# Patient Record
Sex: Female | Born: 1950 | Race: White | Hispanic: No | Marital: Married | Smoking: Never smoker
Health system: Southern US, Community
[De-identification: ages and names within clinical notes are randomized; demographics above are authoritative.]

## PROBLEM LIST (undated history)

## (undated) HISTORY — PX: CATARACT EXTRACTION: SUR2

## (undated) HISTORY — PX: COLONOSCOPY: SHX174

## (undated) HISTORY — PX: APPENDECTOMY: SHX54

## (undated) HISTORY — PX: POLYPECTOMY: SHX149

---

## 1999-09-10 ENCOUNTER — Encounter: Payer: Self-pay | Admitting: Obstetrics and Gynecology

## 1999-09-10 ENCOUNTER — Encounter: Admission: RE | Admit: 1999-09-10 | Discharge: 1999-09-10 | Payer: Self-pay | Admitting: Obstetrics and Gynecology

## 2001-10-07 ENCOUNTER — Encounter: Payer: Self-pay | Admitting: Obstetrics and Gynecology

## 2001-10-07 ENCOUNTER — Encounter: Admission: RE | Admit: 2001-10-07 | Discharge: 2001-10-07 | Payer: Self-pay | Admitting: Obstetrics and Gynecology

## 2003-10-25 ENCOUNTER — Encounter: Admission: RE | Admit: 2003-10-25 | Discharge: 2003-10-25 | Payer: Self-pay | Admitting: Obstetrics and Gynecology

## 2004-08-16 ENCOUNTER — Emergency Department (HOSPITAL_COMMUNITY): Admission: EM | Admit: 2004-08-16 | Discharge: 2004-08-17 | Payer: Self-pay | Admitting: Emergency Medicine

## 2004-08-28 ENCOUNTER — Ambulatory Visit: Payer: Self-pay | Admitting: Family Medicine

## 2004-10-02 ENCOUNTER — Ambulatory Visit: Payer: Self-pay | Admitting: Family Medicine

## 2004-10-26 ENCOUNTER — Ambulatory Visit (HOSPITAL_COMMUNITY): Admission: RE | Admit: 2004-10-26 | Discharge: 2004-10-26 | Payer: Self-pay | Admitting: Obstetrics and Gynecology

## 2005-05-11 ENCOUNTER — Encounter: Admission: RE | Admit: 2005-05-11 | Discharge: 2005-05-11 | Payer: Self-pay | Admitting: Orthopedic Surgery

## 2006-06-11 ENCOUNTER — Ambulatory Visit: Payer: Self-pay | Admitting: Family Medicine

## 2006-11-07 ENCOUNTER — Ambulatory Visit: Payer: Self-pay | Admitting: Internal Medicine

## 2006-11-21 ENCOUNTER — Encounter (INDEPENDENT_AMBULATORY_CARE_PROVIDER_SITE_OTHER): Payer: Self-pay | Admitting: Internal Medicine

## 2006-11-21 ENCOUNTER — Encounter: Payer: Self-pay | Admitting: Internal Medicine

## 2006-11-21 ENCOUNTER — Ambulatory Visit: Payer: Self-pay | Admitting: Internal Medicine

## 2007-11-16 ENCOUNTER — Telehealth (INDEPENDENT_AMBULATORY_CARE_PROVIDER_SITE_OTHER): Payer: Self-pay | Admitting: Internal Medicine

## 2007-11-16 ENCOUNTER — Ambulatory Visit: Payer: Self-pay | Admitting: Family Medicine

## 2007-11-16 DIAGNOSIS — Z872 Personal history of diseases of the skin and subcutaneous tissue: Secondary | ICD-10-CM | POA: Insufficient documentation

## 2007-11-18 ENCOUNTER — Emergency Department (HOSPITAL_COMMUNITY): Admission: EM | Admit: 2007-11-18 | Discharge: 2007-11-18 | Payer: Self-pay | Admitting: Family Medicine

## 2007-11-20 ENCOUNTER — Inpatient Hospital Stay (HOSPITAL_COMMUNITY): Admission: AD | Admit: 2007-11-20 | Discharge: 2007-11-26 | Payer: Self-pay | Admitting: Obstetrics and Gynecology

## 2007-11-23 ENCOUNTER — Telehealth (INDEPENDENT_AMBULATORY_CARE_PROVIDER_SITE_OTHER): Payer: Self-pay | Admitting: Internal Medicine

## 2007-11-23 ENCOUNTER — Ambulatory Visit: Payer: Self-pay | Admitting: Infectious Diseases

## 2008-04-26 ENCOUNTER — Ambulatory Visit: Payer: Self-pay | Admitting: Family Medicine

## 2008-04-26 DIAGNOSIS — IMO0002 Reserved for concepts with insufficient information to code with codable children: Secondary | ICD-10-CM | POA: Insufficient documentation

## 2008-06-19 ENCOUNTER — Emergency Department (HOSPITAL_COMMUNITY): Admission: EM | Admit: 2008-06-19 | Discharge: 2008-06-19 | Payer: Self-pay | Admitting: Emergency Medicine

## 2008-10-04 ENCOUNTER — Other Ambulatory Visit: Admission: RE | Admit: 2008-10-04 | Discharge: 2008-10-04 | Payer: Self-pay | Admitting: Obstetrics and Gynecology

## 2008-10-21 ENCOUNTER — Encounter: Admission: RE | Admit: 2008-10-21 | Discharge: 2008-10-21 | Payer: Self-pay | Admitting: Obstetrics and Gynecology

## 2008-11-04 ENCOUNTER — Ambulatory Visit: Payer: Self-pay | Admitting: Family Medicine

## 2008-11-04 DIAGNOSIS — R030 Elevated blood-pressure reading, without diagnosis of hypertension: Secondary | ICD-10-CM

## 2008-11-04 DIAGNOSIS — J0100 Acute maxillary sinusitis, unspecified: Secondary | ICD-10-CM

## 2008-11-24 ENCOUNTER — Encounter (INDEPENDENT_AMBULATORY_CARE_PROVIDER_SITE_OTHER): Payer: Self-pay | Admitting: Internal Medicine

## 2008-11-30 ENCOUNTER — Encounter (INDEPENDENT_AMBULATORY_CARE_PROVIDER_SITE_OTHER): Payer: Self-pay | Admitting: Internal Medicine

## 2008-12-28 ENCOUNTER — Encounter (INDEPENDENT_AMBULATORY_CARE_PROVIDER_SITE_OTHER): Payer: Self-pay | Admitting: Internal Medicine

## 2009-09-04 ENCOUNTER — Telehealth: Payer: Self-pay | Admitting: Family Medicine

## 2010-01-03 IMAGING — MG MM SCREEN MAMMOGRAM BILATERAL
4 series · 4 of 4 positions shown · non-contrast
Comparison: none

DG SCREEN MAMMOGRAM BILATERAL
Bilateral CC and MLO view(s) were taken.
Technologist: Mahop Biko

DIGITAL SCREENING MAMMOGRAM WITH CAD:
The breast tissue is heterogeneously dense.  No masses or malignant type calcifications are 
identified.  Compared with prior studies.
Images were processed with CAD.

[R CC]
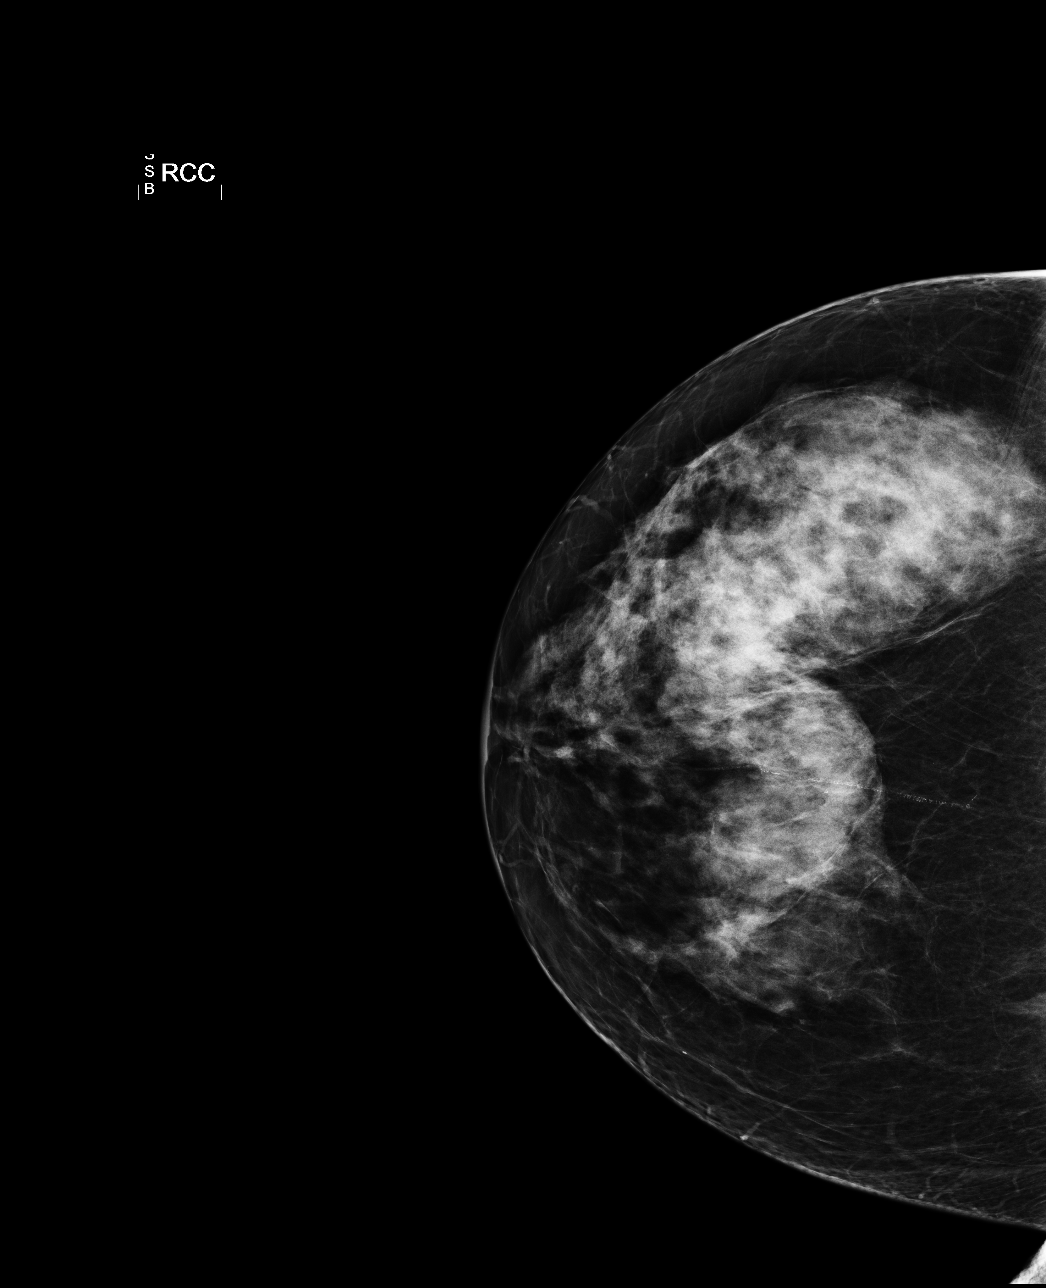

[L CC]
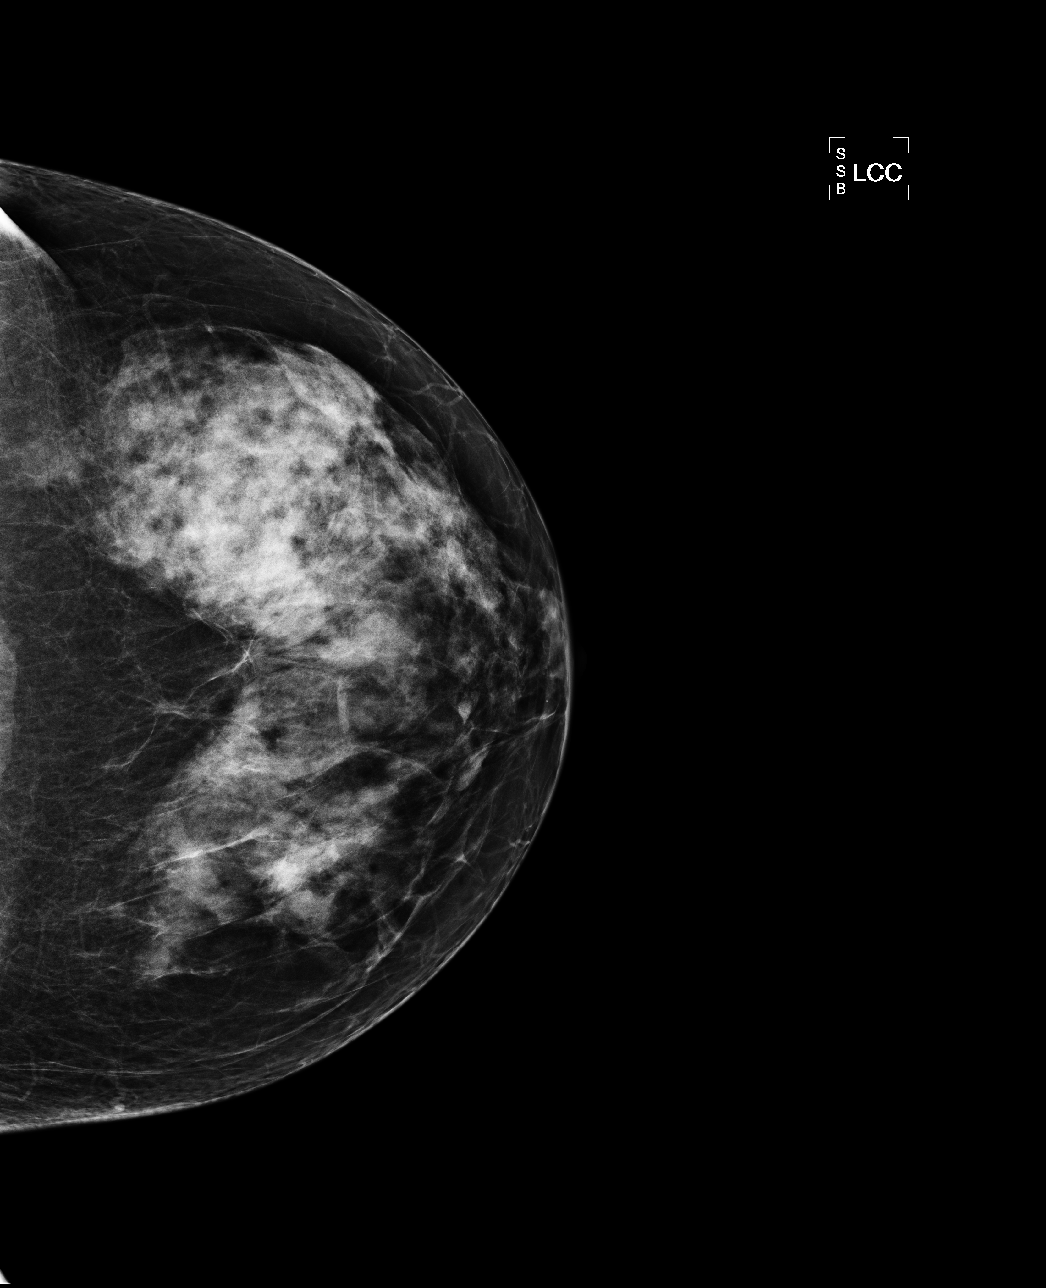

[L MLO]
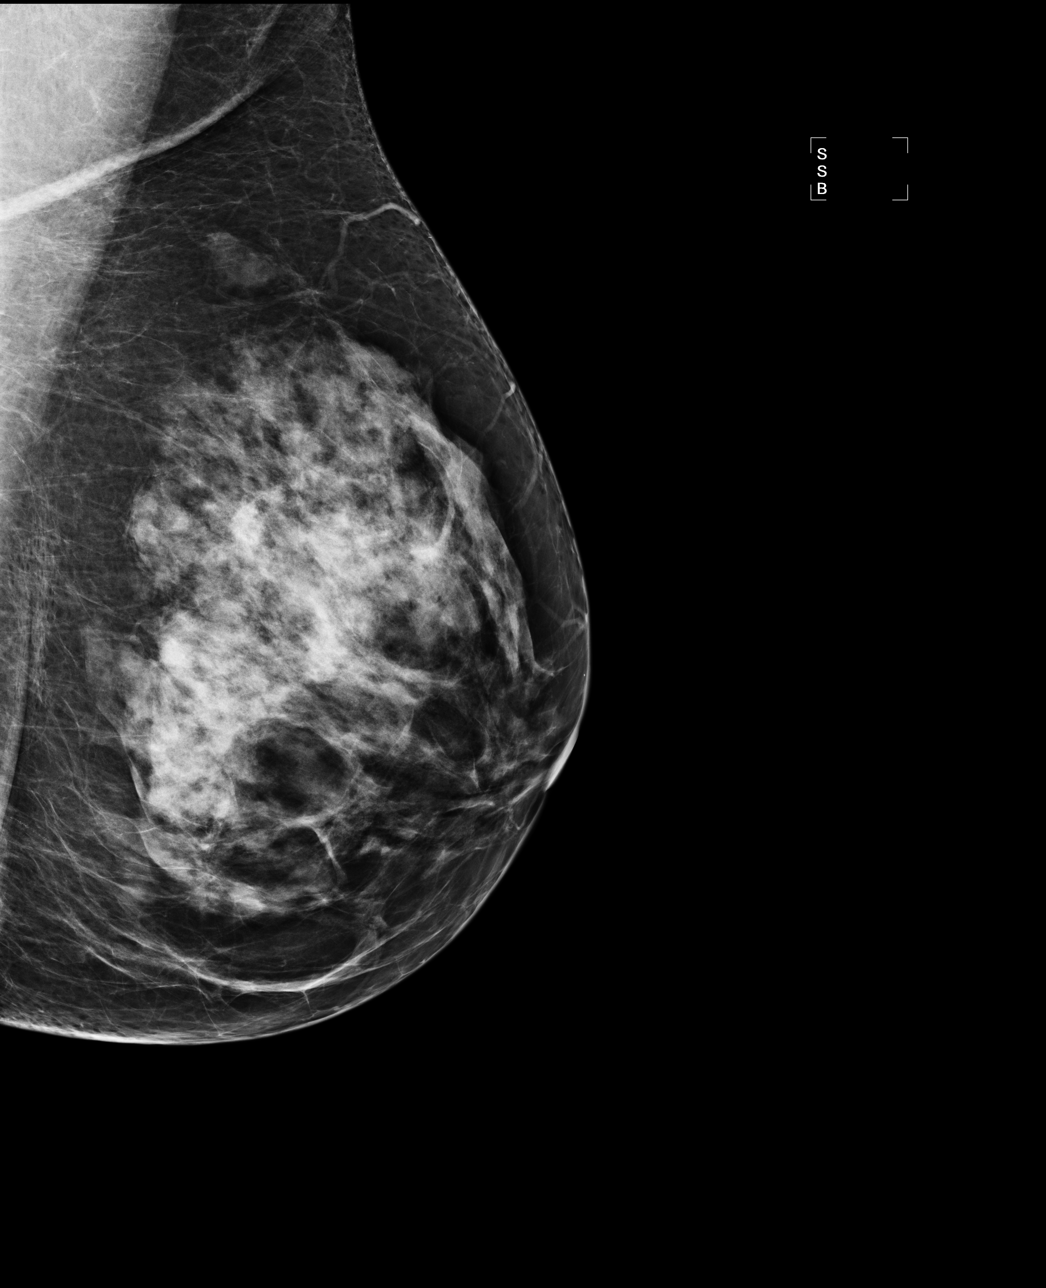

[R MLO]
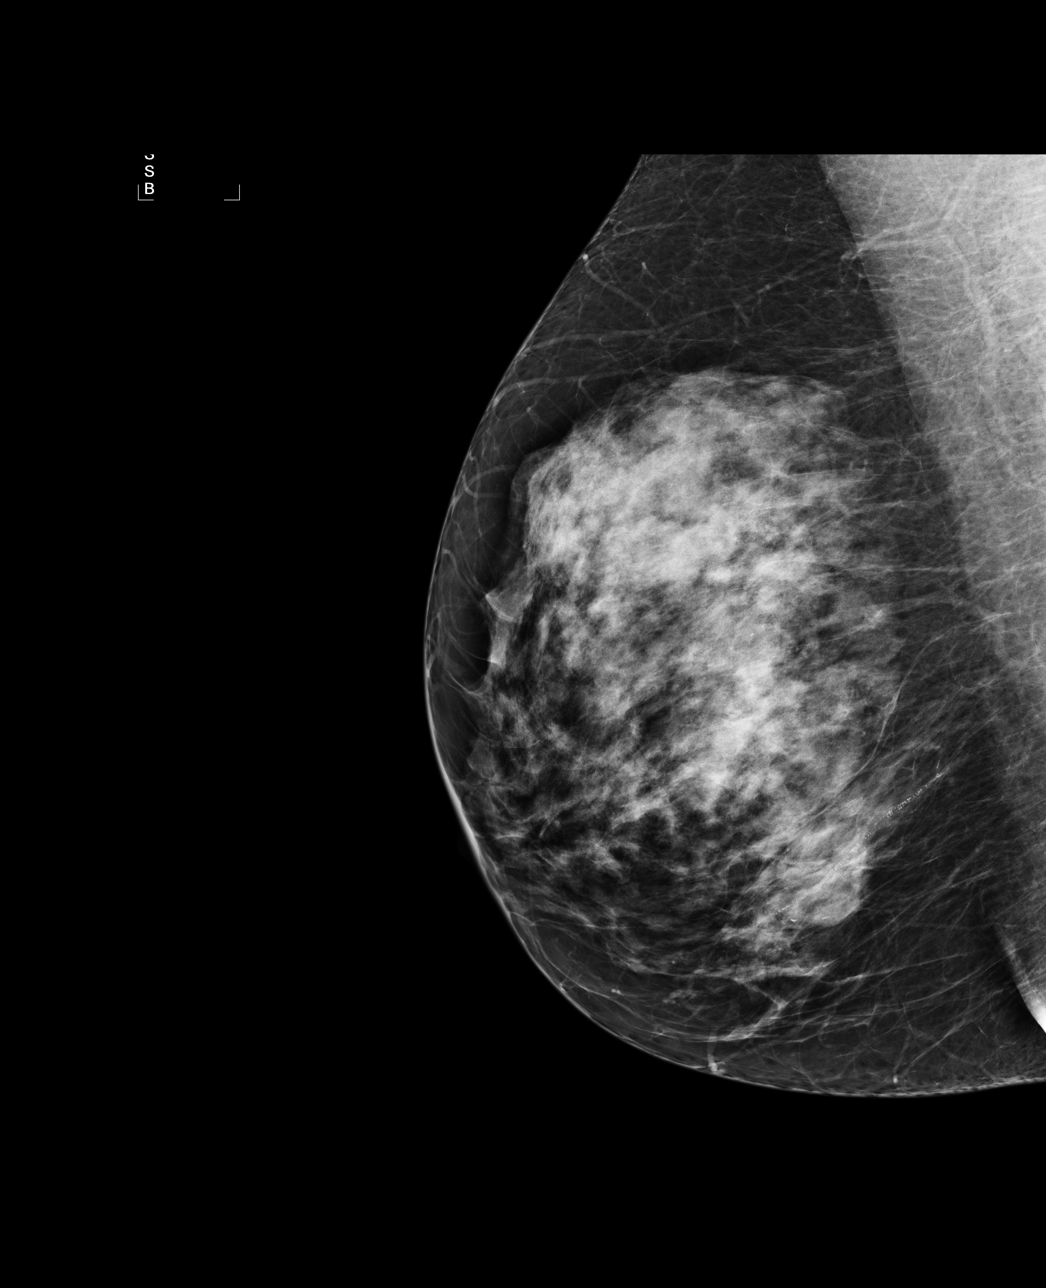

[4 of 4 positions shown; findings below may reference images not displayed]

IMPRESSION: No specific mammographic evidence of malignancy.  Next screening mammogram is recommended in one 
year.

A result letter of this screening mammogram will be mailed directly to the patient.

ASSESSMENT: Negative - BI-RADS 1

Screening mammogram in 1 year.
ANALYZED BY COMPUTER AIDED DETECTION. , THIS PROCEDURE WAS A DIGITAL MAMMOGRAM.

## 2010-05-29 NOTE — Progress Notes (Signed)
Summary: pt requests something for anxiety  Phone Note Call from Patient Call back at Work Phone [phone]   Caller: Patient Summary of Call: Pt states her mother is dying and she is asking if she can have something like paxil or xanax. She knows she will have to get established with another provider.    [facility]. Initial call taken by: Lowella Petties CMA,  Sep 04, 2009 8:59 AM  Follow-up for Phone Call        Medication phoned to pharmacy. Patient Advised.  Follow-up by: Delilah Shan CMA Duncan Dull),  Sep 04, 2009 9:52 AM    New/Updated Medications: ALPRAZOLAM 0.25 MG TABS (ALPRAZOLAM) 1 tab by mouth three times a day as needed anxiety Prescriptions: ALPRAZOLAM 0.25 MG TABS (ALPRAZOLAM) 1 tab by mouth three times a day as needed anxiety  #20 x 0   Entered and Authorized by:   Ruthe Mannan MD   Signed by:   Ruthe Mannan MD on 09/04/2009   Method used:   Telephoned to ...       [facility]* (retail)       [address]       [place], [place]  [address]       Ph: [phone]       Fax: [phone]   RxID:   [id number]

## 2010-05-30 ENCOUNTER — Other Ambulatory Visit (HOSPITAL_COMMUNITY)
Admission: RE | Admit: 2010-05-30 | Discharge: 2010-05-30 | Disposition: A | Discharge status: Home / Self Care | Payer: BC Managed Care – PPO | Source: Ambulatory Visit | Attending: Obstetrics and Gynecology | Admitting: Obstetrics and Gynecology

## 2010-05-30 ENCOUNTER — Other Ambulatory Visit: Payer: Self-pay | Admitting: Obstetrics and Gynecology

## 2010-05-30 DIAGNOSIS — Z01419 Encounter for gynecological examination (general) (routine) without abnormal findings: Secondary | ICD-10-CM | POA: Insufficient documentation

## 2010-08-14 LAB — CBC
HCT: 40.7 % (ref 36.0–46.0)
MCHC: 33.1 g/dL (ref 30.0–36.0)
MCV: 88.5 fL (ref 78.0–100.0)
RBC: 4.6 MIL/uL (ref 3.87–5.11)
WBC: 11.7 10*3/uL — ABNORMAL HIGH (ref 4.0–10.5)

## 2010-08-14 LAB — CK TOTAL AND CKMB (NOT AT ARMC)
CK, MB: 1.5 ng/mL (ref 0.3–4.0)
Relative Index: INVALID (ref 0.0–2.5)

## 2010-08-14 LAB — BASIC METABOLIC PANEL
BUN: 14 mg/dL (ref 6–23)
CO2: 28 mEq/L (ref 19–32)
GFR calc Af Amer: >60 mL/min (ref >60)
GFR calc non Af Amer: >60 mL/min (ref >60)
Glucose, Bld: 120 mg/dL — ABNORMAL HIGH (ref 70–99)
Potassium: 3.8 mEq/L (ref 3.5–5.1)

## 2010-08-14 LAB — DIFFERENTIAL
Eosinophils Absolute: 0.3 10*3/uL (ref 0.0–0.7)
Eosinophils Relative: 2 % (ref 0–5)
Lymphs Abs: 0.4 10*3/uL — ABNORMAL LOW (ref 0.7–4.0)
Monocytes Absolute: 0.6 10*3/uL (ref 0.1–1.0)
Monocytes Relative: 5 % (ref 3–12)
Neutro Abs: 10.5 10*3/uL — ABNORMAL HIGH (ref 1.7–7.7)

## 2010-09-11 NOTE — Op Note (Signed)
NAMECRYSTEN, KAMAN              ACCOUNT NO.:  [id number]   MEDICAL RECORD NO.:  [id number]          PATIENT TYPE:  INP   LOCATION:  9319                          FACILITY:  [facility]   PHYSICIAN:  Charles A. Delcambre, MDDATE OF BIRTH:  Feb 16, 1951   DATE OF PROCEDURE:  11/20/2007  DATE OF DISCHARGE:                               OPERATIVE REPORT   PREOPERATIVE DIAGNOSES:  1. Vulvar abscess.  2. Vulvar cellulitis.   POSTOPERATIVE DIAGNOSES:  1. Vulvar abscess.  2. Vulvar cellulitis.   PROCEDURES:  1. Exploration of vulvar abscess and cellulitis under anesthesia.  2. Incision and drainage of abscessed area.  3. Packing of abscessed area.  4. Cultures of abscessed area.   SURGEON:  Charles A. Delcambre, MD   ASSISTANT:  None.   COMPLICATIONS:  None.   ESTIMATED BLOOD LOSS:  Less than 10 mL.   ANESTHESIA:  Monitored anesthesia care.   OPERATIVE FINDINGS:  Increased area of induration and erythema  approximately 1 cm beyond the marks I made in the office this afternoon.  This would have been about 6 hours ago.  There was no evidence of  necrosis, dusky, or dark tissue or crepitants anywhere about the lesion.  Cultures were done and sent to micro.   INSTRUMENT, SPONGE, AND NEEDLE COUNT:  Correct x2.   DESCRIPTION OF PROCEDURE:  The patient was taken to the operating,  placed in supine position, and sedated.  With adequate anesthesia, she  was placed in dorsal lithotomy position in Universal stirrups.  Sterile  prep and drape was undertaken.  At the area of the spontaneous opening  of the abscess on the inferior aspect of the left labia majora, an 11  blade knife was used to create a slightly larger opening.  I got no  significant effluent other than some exudate.  Culture was taken from  inside the incision and the crevices of the former abscess cavity.  Hemostat was used to break up loculations, but minimal effluent was  noted.  The area was then packed with 0.25-inch  Iodoform up into the  apices of the abscess cavity.  Surrounding erythema was marked in  coordination with erythema and induration.  The mons area was aspirated  with 20 gauge but could get no material out of that area.  Marcaine  0.25% was injected 7 mL for the incision site before the packing.  The  patient tolerated the procedure well, was awakened, and taken to  recovery.  There was no evidence spread beyond the labia intravaginally,  but there was spread superiorly along fascial planes apparently to the  mons area.  This will be watched very closely, and the patient will  remain on Invanz.  Reassess in the morning or otherwise as indicated.       Charles A. Sydnee Cabal, MD  Electronically Signed     CAD/MEDQ  D:  11/20/2007  T:  11/21/2007  Job:  [id number]

## 2010-09-14 NOTE — Discharge Summary (Signed)
Misty Vega, Misty Vega              ACCOUNT NO.:  [id number]   MEDICAL RECORD NO.:  [id number]          PATIENT TYPE:  INP   LOCATION:  9319                          FACILITY:  [facility]   PHYSICIAN:  Charles A. Delcambre, MDDATE OF BIRTH:  03/11/1951   DATE OF ADMISSION:  11/20/2007  DATE OF DISCHARGE:  11/26/2007                               DISCHARGE SUMMARY   DISCHARGE DIAGNOSIS:  Vulvar abscess cellulitis with methicillin-  resistant Staphylococcus aureus.   PROCEDURE:  Exploration of vulvar abscess cellulitis under anesthesia,  incision and drainage with packing.   CULTURES DONE:  Culture did return methicillin-resistant Staphylococcus  aureus.   Final hematocrit 32.2, hemoglobin 10.9, and white blood cell count 6.4.  Of note, which will need to be followed with general medical physician.  AST 56, ALT 56, otherwise, Chem-20 within normal limits.   HISTORY AND PHYSICAL:  As dictated on the chart.   HOSPITAL COURSE:  The patient was admitted and underwent IV antibiotics.  After surgery, she was initially felt to be stable and without culture  knowledge, was started on Invanz.  She continued on her antibiotics.  On  hospital day #2, the decision was made to add doxycycline and with MRSA  returning she was started on vancomycin by infectious disease doctor.  After, I did begin to follow and recommended, we continue vancomycin  this was continued for 2 days and she continued to improve on day three  vancomycin.  She went on to day 4 and at that point, she was discharged  home that afternoon on Bactrim DS 1 p.o. b.i.d.  This was discussed with  Dr. Ninetta Lights who concurred.  She was given precautions for recurrent  lesions or fever over 100 degrees.      Charles A. Sydnee Cabal, MD  Electronically Signed     CAD/MEDQ  D:  01/13/2008  T:  01/14/2008  Job:  [id number]

## 2011-01-25 LAB — DIFFERENTIAL
Basophils Absolute: 0
Eosinophils Absolute: 0.2
Eosinophils Relative: 3
Lymphs Abs: 1.5
Monocytes Relative: 10
Neutrophils Relative %: 54

## 2011-01-25 LAB — COMPREHENSIVE METABOLIC PANEL
ALT: 63 — ABNORMAL HIGH
AST: 56 — ABNORMAL HIGH
Albumin: 2.9 — ABNORMAL LOW
Alkaline Phosphatase: 67
BUN: 7
BUN: 7
CO2: 26
CO2: 31
Calcium: 8.7
Chloride: 104
Chloride: 105
Creatinine, Ser: 0.69
Creatinine, Ser: 0.82
GFR calc Af Amer: >60
GFR calc non Af Amer: >60
Sodium: 139
Total Bilirubin: 0.4
Total Protein: 6.5

## 2011-01-25 LAB — ANAEROBIC CULTURE

## 2011-01-25 LAB — CBC
HCT: 32.2 — ABNORMAL LOW
Hemoglobin: 10.9 — ABNORMAL LOW
Hemoglobin: 9.8 — ABNORMAL LOW
MCV: 86.9
MCV: 86.9
Platelets: 291
RDW: 13.3
WBC: 5
WBC: 6.4

## 2011-01-25 LAB — CULTURE, ROUTINE-ABSCESS: Gram Stain: NONE SEEN

## 2011-12-10 ENCOUNTER — Encounter: Payer: Self-pay | Admitting: Internal Medicine

## 2012-01-01 ENCOUNTER — Other Ambulatory Visit: Payer: Self-pay | Admitting: Obstetrics and Gynecology

## 2012-01-01 DIAGNOSIS — Z1231 Encounter for screening mammogram for malignant neoplasm of breast: Secondary | ICD-10-CM

## 2012-02-11 ENCOUNTER — Other Ambulatory Visit: Payer: Self-pay | Admitting: Obstetrics and Gynecology

## 2012-02-11 ENCOUNTER — Other Ambulatory Visit (HOSPITAL_COMMUNITY)
Admission: RE | Admit: 2012-02-11 | Discharge: 2012-02-11 | Disposition: A | Discharge status: Home / Self Care | Payer: BC Managed Care – PPO | Source: Ambulatory Visit | Attending: Obstetrics and Gynecology | Admitting: Obstetrics and Gynecology

## 2012-02-11 ENCOUNTER — Ambulatory Visit
Admission: RE | Admit: 2012-02-11 | Discharge: 2012-02-11 | Disposition: A | Discharge status: Home / Self Care | Payer: BC Managed Care – PPO | Source: Ambulatory Visit | Attending: Obstetrics and Gynecology | Admitting: Obstetrics and Gynecology

## 2012-02-11 DIAGNOSIS — Z01419 Encounter for gynecological examination (general) (routine) without abnormal findings: Secondary | ICD-10-CM | POA: Insufficient documentation

## 2012-02-11 DIAGNOSIS — Z1231 Encounter for screening mammogram for malignant neoplasm of breast: Secondary | ICD-10-CM

## 2012-02-17 ENCOUNTER — Other Ambulatory Visit: Payer: Self-pay | Admitting: Obstetrics and Gynecology

## 2012-02-17 DIAGNOSIS — R928 Other abnormal and inconclusive findings on diagnostic imaging of breast: Secondary | ICD-10-CM

## 2012-02-20 ENCOUNTER — Ambulatory Visit
Admission: RE | Admit: 2012-02-20 | Discharge: 2012-02-20 | Disposition: A | Discharge status: Home / Self Care | Payer: BC Managed Care – PPO | Source: Ambulatory Visit | Attending: Obstetrics and Gynecology | Admitting: Obstetrics and Gynecology

## 2012-02-20 DIAGNOSIS — R928 Other abnormal and inconclusive findings on diagnostic imaging of breast: Secondary | ICD-10-CM

## 2012-08-06 ENCOUNTER — Encounter: Payer: Self-pay | Admitting: Internal Medicine

## 2012-09-09 ENCOUNTER — Other Ambulatory Visit: Payer: Self-pay | Admitting: Obstetrics and Gynecology

## 2012-09-09 DIAGNOSIS — D242 Benign neoplasm of left breast: Secondary | ICD-10-CM

## 2012-09-15 ENCOUNTER — Ambulatory Visit
Admission: RE | Admit: 2012-09-15 | Discharge: 2012-09-15 | Disposition: A | Discharge status: Home / Self Care | Payer: BC Managed Care – PPO | Source: Ambulatory Visit | Attending: Obstetrics and Gynecology | Admitting: Obstetrics and Gynecology

## 2012-09-15 DIAGNOSIS — D242 Benign neoplasm of left breast: Secondary | ICD-10-CM

## 2013-02-03 ENCOUNTER — Other Ambulatory Visit: Payer: Self-pay

## 2013-02-03 DIAGNOSIS — Z1231 Encounter for screening mammogram for malignant neoplasm of breast: Secondary | ICD-10-CM

## 2013-02-23 ENCOUNTER — Ambulatory Visit
Admission: RE | Admit: 2013-02-23 | Discharge: 2013-02-23 | Disposition: A | Discharge status: Home / Self Care | Payer: BC Managed Care – PPO | Source: Ambulatory Visit

## 2013-02-23 DIAGNOSIS — Z1231 Encounter for screening mammogram for malignant neoplasm of breast: Secondary | ICD-10-CM

## 2013-03-02 ENCOUNTER — Ambulatory Visit: Payer: BC Managed Care – PPO

## 2013-07-09 ENCOUNTER — Emergency Department (HOSPITAL_COMMUNITY)
Admission: EM | Admit: 2013-07-09 | Discharge: 2013-07-09 | Disposition: A | Discharge status: Home / Self Care | Payer: BC Managed Care – PPO | Source: Home / Self Care | Attending: Family Medicine | Admitting: Family Medicine

## 2013-07-09 ENCOUNTER — Encounter (HOSPITAL_COMMUNITY): Payer: Self-pay | Admitting: Emergency Medicine

## 2013-07-09 DIAGNOSIS — K529 Noninfective gastroenteritis and colitis, unspecified: Secondary | ICD-10-CM

## 2013-07-09 DIAGNOSIS — K5289 Other specified noninfective gastroenteritis and colitis: Secondary | ICD-10-CM

## 2013-07-09 MED ORDER — ONDANSETRON 4 MG PO TBDP
ORAL_TABLET | ORAL | Status: AC
  Filled 2013-07-09: qty 2

## 2013-07-09 MED ORDER — ONDANSETRON HCL 4 MG PO TABS: 4.0000 mg | ORAL_TABLET | Freq: Four times a day (QID) | ORAL | Status: DC

## 2013-07-09 MED ORDER — ONDANSETRON 4 MG PO TBDP
8.0000 mg | ORAL_TABLET | Freq: Once | ORAL | Status: AC
  Administered 2013-07-09: 8 mg via ORAL

## 2013-07-09 NOTE — ED Notes (Signed)
Pt  Has  Symptoms  Of  Nausea   Vomiting /  Diarrhea   X         1  Week  On and  Off

## 2013-07-09 NOTE — ED Provider Notes (Signed)
CSN: [id number]     Arrival date & time 07/09/13  1016 History   First MD Initiated Contact with Patient 07/09/13 1121     No chief complaint on file.  (Consider location/radiation/quality/duration/timing/severity/associated sxs/prior Treatment) Patient is a 63 y.o. female presenting with vomiting. The history is provided by the patient.  Emesis Severity:  Moderate Duration:  3 days Quality:  Stomach contents Progression:  Improving Worsened by:  Nothing tried Ineffective treatments:  None tried Associated symptoms: diarrhea   Associated symptoms: no abdominal pain and no fever   Risk factors: no sick contacts and no suspect food intake     No past medical history on file. No past surgical history on file. No family history on file. History  Substance Use Topics  . Smoking status: Not on file  . Smokeless tobacco: Not on file  . Alcohol Use: Not on file   OB History   No data available     Review of Systems  Constitutional: Negative.   Cardiovascular: Negative.   Gastrointestinal: Positive for nausea, vomiting and diarrhea. Negative for abdominal pain and constipation.  Skin: Negative.     Allergies  Review of patient's allergies indicates no known allergies.  Home Medications   Current Outpatient Rx  Name  Route  Sig  Dispense  Refill  . ondansetron (ZOFRAN) 4 MG tablet   Oral   Take 1 tablet (4 mg total) by mouth every 6 (six) hours.   8 tablet   0    BP 145/87  Pulse 77  Temp(Src) 98.9 F (37.2 C) (Oral)  Resp 16  SpO2 98% Physical Exam  Nursing note and vitals reviewed. Constitutional: She is oriented to person, place, and time. She appears well-developed and well-nourished. No distress.  HENT:  Head: Normocephalic.  Mouth/Throat: Oropharynx is clear and moist.  Eyes: Conjunctivae are normal. Pupils are equal, round, and reactive to light.  Neck: Normal range of motion. Neck supple.  Cardiovascular: Regular rhythm and normal heart sounds.    Pulmonary/Chest: Breath sounds normal.  Abdominal: Soft. Bowel sounds are normal. She exhibits no distension and no mass. There is no tenderness. There is no rebound and no guarding.  Lymphadenopathy:    She has no cervical adenopathy.  Neurological: She is alert and oriented to person, place, and time.  Skin: Skin is warm and dry.    ED Course  Procedures (including critical care time) Labs Review Labs Reviewed - No data to display Imaging Review No results found.   MDM   1. Gastroenteritis, acute        Billy Fischer, MD 07/09/13 1144

## 2013-07-09 NOTE — Discharge Instructions (Signed)
Clear liquid , bland diet tonight as tolerated, advance on sat as improved, use medicine as needed, return or see your doctor if any problems. °

## 2013-07-19 ENCOUNTER — Encounter: Payer: Self-pay | Admitting: Internal Medicine

## 2013-07-19 ENCOUNTER — Ambulatory Visit (INDEPENDENT_AMBULATORY_CARE_PROVIDER_SITE_OTHER): Payer: BC Managed Care – PPO | Admitting: Internal Medicine

## 2013-07-19 VITALS — BP 140/80 | HR 68 | Temp 98.0°F | Ht 66.5 in | Wt 171.5 lb

## 2013-07-19 DIAGNOSIS — Z0000 Encounter for general adult medical examination without abnormal findings: Secondary | ICD-10-CM

## 2013-07-19 LAB — COMPREHENSIVE METABOLIC PANEL
ALBUMIN: 4.1 g/dL (ref 3.5–5.2)
ALK PHOS: 66 U/L (ref 39–117)
ALT: 16 U/L (ref 0–35)
AST: 20 U/L (ref 0–37)
BUN: 13 mg/dL (ref 6–23)
CALCIUM: 9.3 mg/dL (ref 8.4–10.5)
CHLORIDE: 104 meq/L (ref 96–112)
CO2: 28 mEq/L (ref 19–32)
Creatinine, Ser: 0.9 mg/dL (ref 0.4–1.2)
GFR: 65.73 mL/min (ref >60.00)
GLUCOSE: 97 mg/dL (ref 70–99)
POTASSIUM: 4.1 meq/L (ref 3.5–5.1)
SODIUM: 139 meq/L (ref 135–145)
TOTAL PROTEIN: 7 g/dL (ref 6.0–8.3)
Total Bilirubin: 0.5 mg/dL (ref 0.3–1.2)

## 2013-07-19 LAB — CBC
HCT: 37.1 % (ref 36.0–46.0)
Hemoglobin: 12.4 g/dL (ref 12.0–15.0)
MCHC: 33.5 g/dL (ref 30.0–36.0)
MCV: 84.2 fl (ref 78.0–100.0)
Platelets: 300 10*3/uL (ref 150.0–400.0)
RBC: 4.4 Mil/uL (ref 3.87–5.11)
RDW: 13.6 % (ref 11.5–14.6)
WBC: 4.5 10*3/uL (ref 4.5–10.5)

## 2013-07-19 LAB — LIPID PANEL
CHOL/HDL RATIO: 3
Cholesterol: 155 mg/dL (ref 0–200)
HDL: 55.5 mg/dL (ref >39.00)
LDL CALC: 84 mg/dL (ref 0–99)
Triglycerides: 78 mg/dL (ref 0.0–149.0)
VLDL: 15.6 mg/dL (ref 0.0–40.0)

## 2013-07-19 NOTE — Progress Notes (Signed)
HPI Pt presents to the clinic today to establish care. She has not had a PCP in the last 4 years. She has no concerns today.  Flu: never Tetanus: 2006 Pap Smear: 2014 Mammogram: 2014 Colon Screening: 2008, 10 year followup Eye Doctor: yearly Dentist: biannually  History reviewed. No pertinent past medical history.  Current Outpatient Prescriptions  Medication Sig Dispense Refill  . Calcium-Vitamin D-Vitamin K (VIACTIV PO) Take by mouth daily.      . ondansetron (ZOFRAN) 4 MG tablet Take 1 tablet (4 mg total) by mouth every 6 (six) hours.  8 tablet  0   No current facility-administered medications for this visit.    No Known Allergies  Family History  Problem Relation Age of Onset  . Cancer Mother     liver  . Hypertension Father   . Diabetes Neg Hx   . Heart disease Neg Hx   . Stroke Neg Hx     History   Social History  . Marital Status: Married    Spouse Name: N/A    Number of Children: N/A  . Years of Education: N/A   Occupational History  . Not on file.   Social History Main Topics  . Smoking status: Never Smoker   . Smokeless tobacco: Never Used  . Alcohol Use: No  . Drug Use: No  . Sexual Activity: Not on file   Other Topics Concern  . Not on file   Social History Narrative  . No narrative on file    ROS:  Constitutional: Denies fever, malaise, fatigue, headache or abrupt weight changes.  HEENT: Denies eye pain, eye redness, ear pain, ringing in the ears, wax buildup, runny nose, nasal congestion, bloody nose, or sore throat. Respiratory: Denies difficulty breathing, shortness of breath, cough or sputum production.   Cardiovascular: Denies chest pain, chest tightness, palpitations or swelling in the hands or feet.  Gastrointestinal: Denies abdominal pain, bloating, constipation, diarrhea or blood in the stool.  GU: Denies frequency, urgency, pain with urination, blood in urine, odor or discharge. Musculoskeletal: Denies decrease in range of  motion, difficulty with gait, muscle pain or joint pain and swelling.  Skin: Denies redness, rashes, lesions or ulcercations.  Neurological: Denies dizziness, difficulty with memory, difficulty with speech or problems with balance and coordination.   No other specific complaints in a complete review of systems (except as listed in HPI above).  PE:  BP 140/80  Pulse 68  Temp(Src) 98 F (36.7 C) (Oral)  Ht 5' 6.5" (1.689 m)  Wt 171 lb 8 oz (77.792 kg)  BMI 27.27 kg/m2 Wt Readings from Last 3 Encounters:  07/19/13 171 lb 8 oz (77.792 kg)  11/04/08 160 lb 8 oz (72.802 kg)  04/26/08 163 lb (73.936 kg)    General: Appears her stated age, well developed, well nourished in NAD. HEENT: Head: normal shape and size; Eyes: sclera white, no icterus, conjunctiva pink, PERRLA and EOMs intact; Ears: Tm's gray and intact, normal light reflex; Nose: mucosa pink and moist, septum midline; Throat/Mouth: Teeth present, mucosa pink and moist, no lesions or ulcerations noted.  Neck: Normal range of motion. Neck supple, trachea midline. No massses, lumps or thyromegaly present.  Cardiovascular: Normal rate and rhythm. S1,S2 noted.  No murmur, rubs or gallops noted. No JVD or BLE edema. No carotid bruits noted. Pulmonary/Chest: Normal effort and positive vesicular breath sounds. No respiratory distress. No wheezes, rales or ronchi noted.  Abdomen: Soft and nontender. Normal bowel sounds, no bruits noted. No distention  or masses noted. Liver, spleen and kidneys non palpable. Musculoskeletal: Normal range of motion. No signs of joint swelling. No difficulty with gait.  Neurological: Alert and oriented. Cranial nerves II-XII intact. Coordination normal. +DTRs bilaterally. Psychiatric: Mood and affect normal. Behavior is normal. Judgment and thought content normal.   EKG:  BMET    Component Value Date/Time   NA 138 06/19/2008 1224   K 3.8 06/19/2008 1224   CL 103 06/19/2008 1224   CO2 28 06/19/2008 1224    GLUCOSE 120* 06/19/2008 1224   BUN 14 06/19/2008 1224   CREATININE 0.85 06/19/2008 1224   CALCIUM 9.2 06/19/2008 1224   GFRNONAA >60 06/19/2008 1224   GFRAA  Value: >60        The eGFR has been calculated using the MDRD equation. This calculation has not been validated in all clinical situations. eGFR's persistently <60 mL/min signify possible Chronic Kidney Disease. 06/19/2008 1224    Lipid Panel  No results found for this basename: chol, trig, hdl, cholhdl, vldl, ldlcalc    CBC    Component Value Date/Time   WBC 11.7* 06/19/2008 1224   RBC 4.60 06/19/2008 1224   HGB 13.5 06/19/2008 1224   HCT 40.7 06/19/2008 1224   PLT 246 06/19/2008 1224   MCV 88.5 06/19/2008 1224   MCHC 33.1 06/19/2008 1224   RDW 13.3 06/19/2008 1224   LYMPHSABS 0.4* 06/19/2008 1224   MONOABS 0.6 06/19/2008 1224   EOSABS 0.3 06/19/2008 1224   BASOSABS 0.0 06/19/2008 1224    Hgb A1C No results found for this basename: HGBA1C     Assessment and Plan:  Preventative Health Maintenance:  Encouraged her to work on diet and exercise Will obtain screening labs today  RTC in 1 year or sooner if needed

## 2013-07-19 NOTE — Patient Instructions (Addendum)

## 2013-07-19 NOTE — Progress Notes (Signed)
Pre visit review using our clinic review tool, if applicable. No additional management support is needed unless otherwise documented below in the visit note. 

## 2013-07-22 ENCOUNTER — Encounter: Payer: Self-pay | Admitting: Family Medicine

## 2014-05-19 ENCOUNTER — Ambulatory Visit (INDEPENDENT_AMBULATORY_CARE_PROVIDER_SITE_OTHER): Payer: BC Managed Care – PPO | Admitting: Internal Medicine

## 2014-05-19 ENCOUNTER — Encounter: Payer: Self-pay | Admitting: Internal Medicine

## 2014-05-19 VITALS — BP 124/80 | HR 73 | Temp 98.1°F | Wt 177.0 lb

## 2014-05-19 DIAGNOSIS — R0982 Postnasal drip: Secondary | ICD-10-CM

## 2014-05-19 DIAGNOSIS — R05 Cough: Secondary | ICD-10-CM

## 2014-05-19 DIAGNOSIS — R059 Cough, unspecified: Secondary | ICD-10-CM

## 2014-05-19 NOTE — Progress Notes (Signed)
HPI  Pt presents to the clinic today with c/o cough and post nasal drip. She reports this has been intermittent over the last 2 months. The cough is non productive. Her throat has been sore, mostly on the left side. She denies headache , runny nose, fever or shortness of breath. She has not taken anything OTC. She has no history of allergies or breathing problems. She has not had sick contacts that she is aware of. She does not take flu shots.  Review of Systems     History reviewed. No pertinent past medical history.  Family History  Problem Relation Age of Onset  . Cancer Mother     liver  . Hypertension Father   . Diabetes Neg Hx   . Heart disease Neg Hx   . Stroke Neg Hx     History   Social History  . Marital Status: Married    Spouse Name: N/A    Number of Children: N/A  . Years of Education: N/A   Occupational History  . Not on file.   Social History Main Topics  . Smoking status: Never Smoker   . Smokeless tobacco: Never Used  . Alcohol Use: No  . Drug Use: No  . Sexual Activity: Not on file   Other Topics Concern  . Not on file   Social History Narrative    No Known Allergies   Constitutional:  Denies headache, fatigue, fever or abrupt weight changes.  HEENT:  Positive sore throat. Denies eye redness, eye pain, pressure behind the eyes, facial pain, nasal congestion, ear pain, ringing in the ears, wax buildup, runny nose or bloody nose. Respiratory: Positive cough. Denies difficulty breathing or shortness of breath.  Cardiovascular: Denies chest pain, chest tightness, palpitations or swelling in the hands or feet.   No other specific complaints in a complete review of systems (except as listed in HPI above).  Objective:   BP 124/80 mmHg  Pulse 73  Temp(Src) 98.1 F (36.7 C) (Oral)  Wt 177 lb (80.287 kg)  SpO2 98% Wt Readings from Last 3 Encounters:  05/19/14 177 lb (80.287 kg)  07/19/13 171 lb 8 oz (77.792 kg)  11/04/08 160 lb 8 oz (72.802 kg)      General: Appears her stated age, well developed, well nourished in NAD. HEENT: Head: normal shape and size, no sinus tenderness noted; Eyes: sclera white, no icterus, conjunctiva pink; Ears: Tm's gray and intact, normal light reflex; Nose: mucosa pink and moist, septum midline; Throat/Mouth: + PND. Teeth present, mucosa pink and moist, no exudate noted, no lesions or ulcerations noted.  Neck: No lymphadenopathy.  Cardiovascular: Normal rate and rhythm. S1,S2 noted.  No murmur, rubs or gallops noted.  Pulmonary/Chest: Normal effort and positive vesicular breath sounds. No respiratory distress. No wheezes, rales or ronchi noted.      Assessment & Plan:   Cough secondary to PND:  Get some rest and drink plenty of water Do salt water gargles/Ibuprofen for the sore throat Get zrytec OTC and take daily x 2 weeks Watch for fever, colored mucous, worsening cough or shortness of breath  RTC as needed or if symptoms persist.

## 2014-05-19 NOTE — Patient Instructions (Signed)
Cough, Adult  A cough is a reflex that helps clear your throat and airways. It can help heal the body or may be a reaction to an irritated airway. A cough may only last 2 or 3 weeks (acute) or may last more than 8 weeks (chronic).  CAUSES Acute cough:  Viral or bacterial infections. Chronic cough:  Infections.  Allergies.  Asthma.  Post-nasal drip.  Smoking.  Heartburn or acid reflux.  Some medicines.  Chronic lung problems (COPD).  Cancer. SYMPTOMS   Cough.  Fever.  Chest pain.  Increased breathing rate.  High-pitched whistling sound when breathing (wheezing).  Colored mucus that you cough up (sputum). TREATMENT   A bacterial cough may be treated with antibiotic medicine.  A viral cough must run its course and will not respond to antibiotics.  Your caregiver may recommend other treatments if you have a chronic cough. HOME CARE INSTRUCTIONS   Only take over-the-counter or prescription medicines for pain, discomfort, or fever as directed by your caregiver. Use cough suppressants only as directed by your caregiver.  Use a cold steam vaporizer or humidifier in your bedroom or home to help loosen secretions.  Sleep in a semi-upright position if your cough is worse at night.  Rest as needed.  Stop smoking if you smoke. SEEK IMMEDIATE MEDICAL CARE IF:   You have pus in your sputum.  Your cough starts to worsen.  You cannot control your cough with suppressants and are losing sleep.  You begin coughing up blood.  You have difficulty breathing.  You develop pain which is getting worse or is uncontrolled with medicine.  You have a fever. MAKE SURE YOU:   Understand these instructions.  Will watch your condition.  Will get help right away if you are not doing well or get worse. Document Released: 10/12/2010 Document Revised: 07/08/2011 Document Reviewed: 10/12/2010 Exit[employer] Patient Information 2015 [employer]. This information is not intended  to replace advice given to you by your health care provider. Make sure you discuss any questions you have with your health care provider.  

## 2014-05-19 NOTE — Progress Notes (Signed)
Pre visit review using our clinic review tool, if applicable. No additional management support is needed unless otherwise documented below in the visit note. 

## 2015-02-20 ENCOUNTER — Ambulatory Visit (INDEPENDENT_AMBULATORY_CARE_PROVIDER_SITE_OTHER): Payer: BC Managed Care – PPO | Admitting: Internal Medicine

## 2015-02-20 ENCOUNTER — Encounter: Payer: Self-pay | Admitting: Internal Medicine

## 2015-02-20 VITALS — BP 136/82 | HR 79 | Temp 98.2°F | Wt 172.0 lb

## 2015-02-20 DIAGNOSIS — B029 Zoster without complications: Secondary | ICD-10-CM | POA: Diagnosis not present

## 2015-02-20 MED ORDER — GABAPENTIN 100 MG PO CAPS: 100.0000 mg | ORAL_CAPSULE | Freq: Three times a day (TID) | ORAL | Status: DC

## 2015-02-20 MED ORDER — HYDROCODONE-ACETAMINOPHEN 5-325 MG PO TABS: 1.0000 | ORAL_TABLET | Freq: Four times a day (QID) | ORAL | Status: DC | PRN

## 2015-02-20 MED ORDER — VALACYCLOVIR HCL 1 G PO TABS: 1000.0000 mg | ORAL_TABLET | Freq: Three times a day (TID) | ORAL | Status: DC

## 2015-02-20 NOTE — Patient Instructions (Signed)

## 2015-02-20 NOTE — Progress Notes (Signed)
Pre visit review using our clinic review tool, if applicable. No additional management support is needed unless otherwise documented below in the visit note. 

## 2015-02-20 NOTE — Progress Notes (Signed)
Subjective:    Patient ID: Misty Vega, female    DOB: [birth year], 64 y.o.   MRN: [id number]  HPI  Pt presents to the clinic today with c/o a headache and rash on the right side of her forehead. This started last night. The lesions burn. They do not itch. She describes the headache as sharp and stabbing. It is located on the right side of her head. She denies dizziness or blurred vision. She has not come in contact with anything that she may be allergic to. She has had chicken pox as a child. She never had the shingles vaccine. She was recently visiting a sick friend but they did not have a similar rash.  Review of Systems      History reviewed. No pertinent past medical history.  Current Outpatient Prescriptions  Medication Sig Dispense Refill  . Calcium-Vitamin D-Vitamin K (VIACTIV PO) Take by mouth daily.    Marland Kitchen gabapentin (NEURONTIN) 100 MG capsule Take 1 capsule (100 mg total) by mouth 3 (three) times daily. 30 capsule 1  . HYDROcodone-acetaminophen (NORCO/VICODIN) 5-325 MG tablet Take 1 tablet by mouth every 6 (six) hours as needed for moderate pain. 30 tablet 0  . valACYclovir (VALTREX) 1000 MG tablet Take 1 tablet (1,000 mg total) by mouth 3 (three) times daily. 30 tablet 0   No current facility-administered medications for this visit.    No Known Allergies  Family History  Problem Relation Age of Onset  . Cancer Mother     liver  . Hypertension Father   . Diabetes Neg Hx   . Heart disease Neg Hx   . Stroke Neg Hx     Social History   Social History  . Marital Status: Married    Spouse Name: N/A  . Number of Children: N/A  . Years of Education: N/A   Occupational History  . Not on file.   Social History Main Topics  . Smoking status: Never Smoker   . Smokeless tobacco: Never Used  . Alcohol Use: No  . Drug Use: No  . Sexual Activity: Not on file   Other Topics Concern  . Not on file   Social History Narrative     Constitutional: Pt reports  headache. Denies fever, malaise, fatigue, or abrupt weight changes.  HEENT: Denies eye pain, eye redness, ear pain, ringing in the ears, wax buildup, runny nose, nasal congestion, bloody nose, or sore throat. Respiratory: Denies difficulty breathing, shortness of breath, cough or sputum production.   Cardiovascular: Denies chest pain, chest tightness, palpitations or swelling in the hands or feet.  Skin: Pt reports rash. Denies ulcercations.  Neurological: Denies dizziness, difficulty with memory, difficulty with speech or problems with balance and coordination.    No other specific complaints in a complete review of systems (except as listed in HPI above).  Objective:   Physical Exam  BP 136/82 mmHg  Pulse 79  Temp(Src) 98.2 F (36.8 C) (Oral)  Wt 172 lb (78.019 kg)  SpO2 98% Wt Readings from Last 3 Encounters:  02/20/15 172 lb (78.019 kg)  05/19/14 177 lb (80.287 kg)  07/19/13 171 lb 8 oz (77.792 kg)    General: Appears her stated age, well developed, well nourished in NAD. Skin: Warm, dry and intact. Vesicular lesions on an erythematous base noted starting on midline forehead and spreading to the right side of the face. HEENT: Head: normal shape and size; Eyes: sclera white, no icterus, conjunctiva pink, PERRLA and EOMs intact; Ears: Tm's  gray and intact, normal light reflex; Cardiovascular: Normal rate and rhythm. S1,S2 noted.  No murmur, rubs or gallops noted.  Pulmonary/Chest: Normal effort and positive vesicular breath sounds. No respiratory distress. No wheezes, rales or ronchi noted.  Neurological: Alert and oriented.   BMET    Component Value Date/Time   NA 139 07/19/2013 0952   K 4.1 07/19/2013 0952   CL 104 07/19/2013 0952   CO2 28 07/19/2013 0952   GLUCOSE 97 07/19/2013 0952   BUN 13 07/19/2013 0952   CREATININE 0.9 07/19/2013 0952   CALCIUM 9.3 07/19/2013 0952   GFRNONAA >60 06/19/2008 1224   GFRAA  06/19/2008 1224    >60        The eGFR has been  calculated using the MDRD equation. This calculation has not been validated in all clinical situations. eGFR's persistently <60 mL/min signify possible Chronic Kidney Disease.    Lipid Panel     Component Value Date/Time   CHOL 155 07/19/2013 0952   TRIG 78.0 07/19/2013 0952   HDL 55.50 07/19/2013 0952   CHOLHDL 3 07/19/2013 0952   VLDL 15.6 07/19/2013 0952   LDLCALC 84 07/19/2013 0952    CBC    Component Value Date/Time   WBC 4.5 07/19/2013 0952   RBC 4.40 07/19/2013 0952   HGB 12.4 07/19/2013 0952   HCT 37.1 07/19/2013 0952   PLT 300.0 07/19/2013 0952   MCV 84.2 07/19/2013 0952   MCHC 33.5 07/19/2013 0952   RDW 13.6 07/19/2013 0952   LYMPHSABS 0.4* 06/19/2008 1224   MONOABS 0.6 06/19/2008 1224   EOSABS 0.3 06/19/2008 1224   BASOSABS 0.0 06/19/2008 1224    Hgb A1C No results found for: HGBA1C       Assessment & Plan:   Shingles:  eRx for Valtrex 1gm TID x 10 days eRx for Neurontin 100 mg TID prn pain RX for Norco 5-325 mg tab prn severe pain Advised her if she starts having sharp pain, burning in the eye, she needs to see any eye doctor immediately She will get her shingles vaccine once she recovers from this  RTC as needed or if symptoms persist or worsen

## 2015-03-03 ENCOUNTER — Telehealth: Payer: Self-pay | Admitting: Internal Medicine

## 2015-03-03 NOTE — Telephone Encounter (Signed)
[facility] Patient Name: Misty Vega DOB: 27-Jul-1950 Initial Comment Caller states her shingles are itching, wants to know what is best for them. Nurse Assessment Nurse: Mechele Dawley, RN, Amy Date/Time Eilene Ghazi Time): 03/03/2015 11:34:38 AM Confirm and document reason for call. If symptomatic, describe symptoms. ---CALLER STATES THAT SHE IS DIAGNOSED WITH SHINGLES. SHE HAS IT ON HER FACE. IT IS NOT IN HER EYES - SLIGHTLY CLOSE. SHE IS WANTING TO PUT HYDROCORTISONE CREAM ON THEM, WANTED TO KNOW IF THIS WAS OKAY OR NOT? Has the patient traveled out of the country within the last 30 days? ---Not Applicable Does the patient have any new or worsening symptoms? ---No Please document clinical information provided and list any resource used. ---INFORMATION ON RASH ON HER FACE WHAT SHE CAN USE AND FOR THE HEADACHE. NOTHING NEW - JUST ADVICE. Guidelines Guideline Title Affirmed Question Affirmed Notes Shingles Shingles, questions about Final Disposition [place], RN, Amy Comments SHE WAS INSTRUCTED TO USE A BAKING SODA PASTE WITH COOL RINSE ON HER AREA ON THE FACE ONLY. NO CREAMS OR ANYTHING. SHE STATES THAT DR. BAITY HAD INSTRUCTED IBUPROFEN FOR THE HEADACHE AND WANTED TO KNOW IF THAT WAS STILL OKAY TO DO. INSTRUCTED YES TO CONTINUE TO DO SO. ALSO, INSTRUCTED IF SHE HAS ANY WORSENING IN ANY SYMPTOMS TO CALL BACK. SHE VERBALIZED UNDERSTANDING OF ALL INSTRUCTIONS GIVEN. Disagree/Comply: Comply

## 2015-03-06 ENCOUNTER — Telehealth: Payer: Self-pay

## 2015-03-06 DIAGNOSIS — B029 Zoster without complications: Secondary | ICD-10-CM

## 2015-03-06 NOTE — Telephone Encounter (Signed)
Pt was seen on 02/20/15 for shingles; pt is better but still having h/as; pt wants to know if needs refill of valacyclovir (pt has finished the valacyclovir). Pt also wants to know if hydrocodone can be refilled. Pt said shingles appears to be going away but this morning was some swelling at her eye and continues with h/a. Pt has one hydrocodone left and request cb on 03/07/15.Please advise.

## 2015-03-07 ENCOUNTER — Other Ambulatory Visit: Payer: Self-pay | Admitting: Internal Medicine

## 2015-03-07 DIAGNOSIS — B029 Zoster without complications: Secondary | ICD-10-CM

## 2015-03-07 MED ORDER — HYDROCODONE-ACETAMINOPHEN 5-325 MG PO TABS: 1.0000 | ORAL_TABLET | Freq: Four times a day (QID) | ORAL | Status: DC | PRN

## 2015-03-07 NOTE — Telephone Encounter (Signed)
Pt states she has picked up the neurotin Rx and wants to hold off on Norco as the headaches are the biggest concern---pt purchased OTC migraine medicine and if it helps she will not need the Norco---pt will call me tomorrow if she decides she needs Norco---I will hold on to the Rx until tomorrow

## 2015-03-07 NOTE — Telephone Encounter (Signed)
I can refill Norco, RX printed and signed and placed in MYD box. Is the Neurontin helping at all. The Valtrex should not need to be refilled.

## 2015-03-13 ENCOUNTER — Telehealth: Payer: Self-pay | Admitting: Internal Medicine

## 2015-03-13 NOTE — Telephone Encounter (Signed)
Yes, she still needs the vaccine. Has she called her insurance company to see if it is covered in the office or does she need a RX to take to the pharmacy

## 2015-03-13 NOTE — Telephone Encounter (Signed)
Pt would like to have a shingles vaccine and wants to know if she can get the shot after being treated for it.  She was diagnosed on Oct 24th with shingles  cb number is [phone].  Pt goes to [facility] if something has to be called in.   Thank you

## 2015-03-13 NOTE — Telephone Encounter (Signed)
Pt states she will call her insurance to make sure where it is covered or does it not matter and call me to let me know

## 2015-03-20 ENCOUNTER — Other Ambulatory Visit: Payer: Self-pay | Admitting: Internal Medicine

## 2015-03-20 NOTE — Telephone Encounter (Signed)
Pt states she is taking them BID but some days she takes TID---changed Rx for TID #90--please advise

## 2015-03-20 NOTE — Telephone Encounter (Signed)
Is she taking TID or how is she taking it? If so we need to increase quant to #90.

## 2015-03-20 NOTE — Telephone Encounter (Signed)
Pt going out of town on 03/21/15 at 8 AM; pt request refill gabapentin to [facility] for continuing with h/a at nighttime. bea at [facility] said pt got # 30(10 day rx) on 02/20/15 and 03/07/15. Pt last seen 02/20/15.Pt request cb today.

## 2015-06-05 ENCOUNTER — Encounter: Payer: Self-pay | Admitting: Internal Medicine

## 2015-06-05 ENCOUNTER — Ambulatory Visit (INDEPENDENT_AMBULATORY_CARE_PROVIDER_SITE_OTHER): Payer: BC Managed Care – PPO | Admitting: Internal Medicine

## 2015-06-05 VITALS — BP 140/82 | HR 80 | Temp 98.4°F

## 2015-06-05 DIAGNOSIS — J069 Acute upper respiratory infection, unspecified: Secondary | ICD-10-CM

## 2015-06-05 DIAGNOSIS — B9789 Other viral agents as the cause of diseases classified elsewhere: Principal | ICD-10-CM

## 2015-06-05 MED ORDER — HYDROCOD POLST-CPM POLST ER 10-8 MG/5ML PO SUER: 5.0000 mL | Freq: Every evening | ORAL | Status: DC | PRN

## 2015-06-05 NOTE — Progress Notes (Signed)
HPI  Pt presents to the clinic today with c/o runny nose, sore throat and cough. This started 2 days ago. She is blowing clear mucous out of her nose. The cough is productive of clear mucous. She denies difficulty swallowing. She denies fever, but has had chills and body aches. She has tried Mucinex and Benadryl without relief. She has not had sick contacts. She has no history of seasonal allergies.  Review of Systems   History reviewed. No pertinent past medical history.  Family History  Problem Relation Age of Onset  . Cancer Mother     liver  . Hypertension Father   . Diabetes Neg Hx   . Heart disease Neg Hx   . Stroke Neg Hx     Social History   Social History  . Marital Status: Married    Spouse Name: N/A  . Number of Children: N/A  . Years of Education: N/A   Occupational History  . Not on file.   Social History Main Topics  . Smoking status: Never Smoker   . Smokeless tobacco: Never Used  . Alcohol Use: No  . Drug Use: No  . Sexual Activity: Not on file   Other Topics Concern  . Not on file   Social History Narrative    No Known Allergies   Constitutional: Denies headache, fatigue, fever or abrupt weight changes.  HEENT:  Positive runny nose and sore throat. Denies eye redness, ear pain, ringing in the ears, wax buildup, nasal congestion or bloody nose. Respiratory: Positive cough. Denies difficulty breathing or shortness of breath.  Cardiovascular: Denies chest pain, chest tightness, palpitations or swelling in the hands or feet.   No other specific complaints in a complete review of systems (except as listed in HPI above).  Objective:  BP 140/82 mmHg  Pulse 80  Temp(Src) 98.4 F (36.9 C) (Oral)  Wt   SpO2 98%   General: Appears her stated age, well developed, well nourished in NAD. HEENT: Head: normal shape and size, nosinus tenderness noted; Eyes: sclera white, no icterus, conjunctiva pink; Ears: Tm's gray and intact, normal light reflex; Nose:  mucosa boggy and moist, septum midline; Throat/Mouth: + PND. Teeth present, mucosa pink and moist, no exudate noted, no lesions or ulcerations noted.  Neck:  No adenopathy noted.  Cardiovascular: Normal rate and rhythm. S1,S2 noted.  No murmur, rubs or gallops noted.  Pulmonary/Chest: Normal effort and positive vesicular breath sounds. No respiratory distress. No wheezes, rales or ronchi noted.      Assessment & Plan:   Upper Respiratory Infection:  Get some rest and drink plenty of water Do salt water gargles for the sore throat Continue Mucinex Add in Flonase for runny nose Rx for Hycodan cough syrup  RTC as needed or if symptoms persist.

## 2015-06-05 NOTE — Progress Notes (Signed)
Pre visit review using our clinic review tool, if applicable. No additional management support is needed unless otherwise documented below in the visit note. 

## 2015-06-05 NOTE — Patient Instructions (Signed)
Upper Respiratory Infection, Adult Most upper respiratory infections (URIs) are a viral infection of the air passages leading to the lungs. A URI affects the nose, throat, and upper air passages. The most common type of URI is nasopharyngitis and is typically referred to as "the common cold." URIs run their course and usually go away on their own. Most of the time, a URI does not require medical attention, but sometimes a bacterial infection in the upper airways can follow a viral infection. This is called a secondary infection. Sinus and middle ear infections are common types of secondary upper respiratory infections. Bacterial pneumonia can also complicate a URI. A URI can worsen asthma and chronic obstructive pulmonary disease (COPD). Sometimes, these complications can require emergency medical care and may be life threatening.  CAUSES Almost all URIs are caused by viruses. A virus is a type of germ and can spread from one person to another.  RISKS FACTORS You may be at risk for a URI if:   You smoke.   You have chronic heart or lung disease.  You have a weakened defense (immune) system.   You are very young or very old.   You have nasal allergies or asthma.  You work in crowded or poorly ventilated areas.  You work in health care facilities or schools. SIGNS AND SYMPTOMS  Symptoms typically develop 2-3 days after you come in contact with a cold virus. Most viral URIs last 7-10 days. However, viral URIs from the influenza virus (flu virus) can last 14-18 days and are typically more severe. Symptoms may include:   Runny or stuffy (congested) nose.   Sneezing.   Cough.   Sore throat.   Headache.   Fatigue.   Fever.   Loss of appetite.   Pain in your forehead, behind your eyes, and over your cheekbones (sinus pain).  Muscle aches.  DIAGNOSIS  Your health care provider may diagnose a URI by:  Physical exam.  Tests to check that your symptoms are not due to  another condition such as:  Strep throat.  Sinusitis.  Pneumonia.  Asthma. TREATMENT  A URI goes away on its own with time. It cannot be cured with medicines, but medicines may be prescribed or recommended to relieve symptoms. Medicines may help:  Reduce your fever.  Reduce your cough.  Relieve nasal congestion. HOME CARE INSTRUCTIONS   Take medicines only as directed by your health care provider.   Gargle warm saltwater or take cough drops to comfort your throat as directed by your health care provider.  Use a warm mist humidifier or inhale steam from a shower to increase air moisture. This may make it easier to breathe.  Drink enough fluid to keep your urine clear or pale yellow.   Eat soups and other clear broths and maintain good nutrition.   Rest as needed.   Return to work when your temperature has returned to normal or as your health care provider advises. You may need to stay home longer to avoid infecting others. You can also use a face mask and careful hand washing to prevent spread of the virus.  Increase the usage of your inhaler if you have asthma.   Do not use any tobacco products, including cigarettes, chewing tobacco, or electronic cigarettes. If you need help quitting, ask your health care provider. PREVENTION  The best way to protect yourself from getting a cold is to practice good hygiene.   Avoid oral or hand contact with people with cold   symptoms.   Wash your hands often if contact occurs.  There is no clear evidence that vitamin C, vitamin E, echinacea, or exercise reduces the chance of developing a cold. However, it is always recommended to get plenty of rest, exercise, and practice good nutrition.  SEEK MEDICAL CARE IF:   You are getting worse rather than better.   Your symptoms are not controlled by medicine.   You have chills.  You have worsening shortness of breath.  You have brown or red mucus.  You have yellow or brown nasal  discharge.  You have pain in your face, especially when you bend forward.  You have a fever.  You have swollen neck glands.  You have pain while swallowing.  You have white areas in the back of your throat. SEEK IMMEDIATE MEDICAL CARE IF:   You have severe or persistent:  Headache.  Ear pain.  Sinus pain.  Chest pain.  You have chronic lung disease and any of the following:  Wheezing.  Prolonged cough.  Coughing up blood.  A change in your usual mucus.  You have a stiff neck.  You have changes in your:  Vision.  Hearing.  Thinking.  Mood. MAKE SURE YOU:   Understand these instructions.  Will watch your condition.  Will get help right away if you are not doing well or get worse.   This information is not intended to replace advice given to you by your health care provider. Make sure you discuss any questions you have with your health care provider.   Document Released: 10/09/2000 Document Revised: 08/30/2014 Document Reviewed: 07/21/2013 Elsevier Interactive Patient Education 2016 Elsevier Inc.  

## 2015-09-04 ENCOUNTER — Ambulatory Visit (INDEPENDENT_AMBULATORY_CARE_PROVIDER_SITE_OTHER): Payer: BC Managed Care – PPO | Admitting: Internal Medicine

## 2015-09-04 ENCOUNTER — Encounter: Payer: Self-pay | Admitting: Internal Medicine

## 2015-09-04 VITALS — BP 124/84 | HR 70 | Temp 98.1°F | Wt 169.5 lb

## 2015-09-04 DIAGNOSIS — J309 Allergic rhinitis, unspecified: Secondary | ICD-10-CM

## 2015-09-04 MED ORDER — HYDROCOD POLST-CPM POLST ER 10-8 MG/5ML PO SUER: 5.0000 mL | Freq: Every evening | ORAL | Status: DC | PRN

## 2015-09-04 NOTE — Progress Notes (Signed)
Pre visit review using our clinic review tool, if applicable. No additional management support is needed unless otherwise documented below in the visit note. 

## 2015-09-04 NOTE — Progress Notes (Signed)
HPI  Pt presents to the clinic today with c/o runny nose, sore throat and cough. This started 1 week ago. She is blowing clear mucous out of her nose. The cough is non productive. She denies fever, chills or body aches. She has tried Mucinex, Benadryl and Vicks cough syrup with minimal relief. She has no history of seasonal allergies or breathing problems. She has not had sick contacts.  Review of Systems   No past medical history on file.  Family History  Problem Relation Age of Onset  . Cancer Mother     liver  . Hypertension Father   . Diabetes Neg Hx   . Heart disease Neg Hx   . Stroke Neg Hx     Social History   Social History  . Marital Status: Married    Spouse Name: N/A  . Number of Children: N/A  . Years of Education: N/A   Occupational History  . Not on file.   Social History Main Topics  . Smoking status: Never Smoker   . Smokeless tobacco: Never Used  . Alcohol Use: No  . Drug Use: No  . Sexual Activity: Not on file   Other Topics Concern  . Not on file   Social History Narrative    No Known Allergies   Constitutional: Denies headache, fatigue, fever or abrupt weight changes.  HEENT:  Positive runny nose and sore throat. Denies eye redness, ear pain, ringing in the ears, wax buildup, nasal congestion or bloody nose. Respiratory: Positive cough. Denies difficulty breathing or shortness of breath.  Cardiovascular: Denies chest pain, chest tightness, palpitations or swelling in the hands or feet.   No other specific complaints in a complete review of systems (except as listed in HPI above).  Objective:  BP 124/84 mmHg  Pulse 70  Temp(Src) 98.1 F (36.7 C) (Oral)  Wt 169 lb 8 oz (76.885 kg)  SpO2 97%   General: Appears her stated age, well developed, well nourished in NAD. HEENT: Head: normal shape and size, no sinus tenderness noted; Eyes: sclera white, no icterus, conjunctiva pink; Ears: Tm's gray and intact, normal light reflex; Nose: mucosa  boggy and moist, septum midline; Throat/Mouth: + PND. Teeth present, mucosa erythematous and moist, no exudate noted, no lesions or ulcerations noted.  Neck:  No adenopathy noted.  Cardiovascular: Normal rate and rhythm. S1,S2 noted.  No murmur, rubs or gallops noted.  Pulmonary/Chest: Normal effort and positive vesicular breath sounds. No respiratory distress. No wheezes, rales or ronchi noted.      Assessment & Plan:   Allergic Rhinitis  Can use a Neti Pot which can be purchased from your local drug store. Flonase 2 sprays each nostril for 3 days and then daily x 1 week. Allegra daily at night x 1 week RX for Tussionex cough syrup  RTC as needed or if symptoms persist.

## 2015-09-04 NOTE — Patient Instructions (Signed)
Allergic Rhinitis Allergic rhinitis is when the mucous membranes in the nose respond to allergens. Allergens are particles in the air that cause your body to have an allergic reaction. This causes you to release allergic antibodies. Through a chain of events, these eventually cause you to release histamine into the blood stream. Although meant to protect the body, it is this release of histamine that causes your discomfort, such as frequent sneezing, congestion, and an itchy, runny nose.  CAUSES Seasonal allergic rhinitis (hay fever) is caused by pollen allergens that may come from grasses, trees, and weeds. Year-round allergic rhinitis (perennial allergic rhinitis) is caused by allergens such as house dust mites, pet dander, and mold spores. SYMPTOMS  Nasal stuffiness (congestion).  Itchy, runny nose with sneezing and tearing of the eyes. DIAGNOSIS Your health care provider can help you determine the allergen or allergens that trigger your symptoms. If you and your health care provider are unable to determine the allergen, skin or blood testing may be used. Your health care provider will diagnose your condition after taking your health history and performing a physical exam. Your health care provider may assess you for other related conditions, such as asthma, pink eye, or an ear infection. TREATMENT Allergic rhinitis does not have a cure, but it can be controlled by:  Medicines that block allergy symptoms. These may include allergy shots, nasal sprays, and oral antihistamines.  Avoiding the allergen. Hay fever may often be treated with antihistamines in pill or nasal spray forms. Antihistamines block the effects of histamine. There are over-the-counter medicines that may help with nasal congestion and swelling around the eyes. Check with your health care provider before taking or giving this medicine. If avoiding the allergen or the medicine prescribed do not work, there are many new medicines  your health care provider can prescribe. Stronger medicine may be used if initial measures are ineffective. Desensitizing injections can be used if medicine and avoidance does not work. Desensitization is when a patient is given ongoing shots until the body becomes less sensitive to the allergen. Make sure you follow up with your health care provider if problems continue. HOME CARE INSTRUCTIONS It is not possible to completely avoid allergens, but you can reduce your symptoms by taking steps to limit your exposure to them. It helps to know exactly what you are allergic to so that you can avoid your specific triggers. SEEK MEDICAL CARE IF:  You have a fever.  You develop a cough that does not stop easily (persistent).  You have shortness of breath.  You start wheezing.  Symptoms interfere with normal daily activities.   This information is not intended to replace advice given to you by your health care provider. Make sure you discuss any questions you have with your health care provider.   Document Released: 01/08/2001 Document Revised: 05/06/2014 Document Reviewed: 12/21/2012 Elsevier Interactive Patient Education 2016 Elsevier Inc.  

## 2015-09-21 ENCOUNTER — Telehealth: Payer: Self-pay

## 2015-09-21 NOTE — Telephone Encounter (Signed)
Pt was seen 09/04/15 and was given Tussionex; pt has been coughing for the last hour and wants to know if med could be called in today to [facility] or tomorrow to [facility] for the cough. Pt request cb.

## 2015-09-22 ENCOUNTER — Other Ambulatory Visit: Payer: Self-pay | Admitting: Internal Medicine

## 2015-09-22 MED ORDER — HYDROCOD POLST-CPM POLST ER 10-8 MG/5ML PO SUER: 5.0000 mL | Freq: Every evening | ORAL | Status: DC | PRN

## 2015-09-22 NOTE — Telephone Encounter (Signed)
Spoke to pt and she wants refill on cough syrup---she is aware she will have to pick up Rx in office

## 2015-09-22 NOTE — Telephone Encounter (Signed)
Rx left in front office for pick up and pt is aware  

## 2015-09-22 NOTE — Telephone Encounter (Signed)
RX printed and signed and placed in MYD box 

## 2015-09-22 NOTE — Telephone Encounter (Signed)
Is she wanting a refill of Tussionex? That RX would have to be picked up at the office.

## 2015-10-12 ENCOUNTER — Ambulatory Visit (HOSPITAL_BASED_OUTPATIENT_CLINIC_OR_DEPARTMENT_OTHER)
Admission: RE | Admit: 2015-10-12 | Discharge: 2015-10-12 | Disposition: A | Discharge status: Home / Self Care | Payer: BC Managed Care – PPO | Source: Ambulatory Visit | Attending: Family Medicine | Admitting: Family Medicine

## 2015-10-12 ENCOUNTER — Encounter: Payer: Self-pay | Admitting: Family Medicine

## 2015-10-12 ENCOUNTER — Telehealth: Payer: Self-pay | Admitting: Internal Medicine

## 2015-10-12 ENCOUNTER — Ambulatory Visit (INDEPENDENT_AMBULATORY_CARE_PROVIDER_SITE_OTHER): Payer: BC Managed Care – PPO | Admitting: Family Medicine

## 2015-10-12 VITALS — BP 157/85 | HR 74 | Temp 98.5°F | Ht 68.0 in | Wt 165.0 lb

## 2015-10-12 VITALS — BP 165/98 | HR 86 | Ht 68.0 in

## 2015-10-12 DIAGNOSIS — M79604 Pain in right leg: Secondary | ICD-10-CM

## 2015-10-12 DIAGNOSIS — S8991XA Unspecified injury of right lower leg, initial encounter: Secondary | ICD-10-CM | POA: Diagnosis not present

## 2015-10-12 DIAGNOSIS — S82144A Nondisplaced bicondylar fracture of right tibia, initial encounter for closed fracture: Secondary | ICD-10-CM | POA: Diagnosis not present

## 2015-10-12 DIAGNOSIS — W11XXXA Fall on and from ladder, initial encounter: Secondary | ICD-10-CM | POA: Insufficient documentation

## 2015-10-12 DIAGNOSIS — M79675 Pain in left toe(s): Secondary | ICD-10-CM

## 2015-10-12 MED ORDER — HYDROCODONE-ACETAMINOPHEN 5-325 MG PO TABS: 1.0000 | ORAL_TABLET | Freq: Four times a day (QID) | ORAL | Status: DC | PRN

## 2015-10-12 NOTE — Patient Instructions (Signed)
Please go upstairs to [address] and see Dr[facility]  I hope that you are feeling much better soon!

## 2015-10-12 NOTE — Telephone Encounter (Signed)
Pt came to [facility] on crutches; pt was seated in w/c;rt leg elevated and ice applied to rt knee and upper leg; pt fell off 1st or 2nd round of a ladder; pt loss balance and fell backwards and to the left onto concrete. Pt hit back of head but did not lose consciousness; primary pain in rt knee and upper rt leg in the front.pt cannot bear weight on rt leg. Also has pain in lt big toe. No available appts at [facility] or [place]. Pt was scheduled 10/12/15 at 1 pm with Dr Janett Billow Copland. pts husband is driving pt to [facility].

## 2015-10-12 NOTE — Progress Notes (Signed)
[place] [facility] [address], [place], [place] [address] [phone]  Date:  10/12/2015   Name:  Misty Vega   DOB:  05-01-1950   MRN:  [id number]  PCP:  Webb Silversmith, NP    Chief Complaint: Fall   History of Present Illness:  Misty Vega is a 65 y.o. very pleasant female patient who presents with the following:  Here today with an injury- she was volunteering at her church when she fell. She was climbing up a ladder- was on the 1st or 2nd rung when she fell backwards off the ladder. She landed on her left side but notes that she seems to have twisted her right leg.  She has a lot of pain in the right shin.  She also did bump her head on the ground she thinks but not hard,her head does not hurt.  She does not have any other apparent injuries   She is not able to bear weight- had crutches at home from a previous left leg injury and is using these   Her left great toe is a little bit sore and bruised  She did not have any LOC. No vomiting, headache or belly pain  She is generally healthy. Accompanied by her husband today She is retired   There are no active problems to display for this patient.   No past medical history on file.  No past surgical history on file.  Social History  Substance Use Topics  . Smoking status: Never Smoker   . Smokeless tobacco: Never Used  . Alcohol Use: No    Family History  Problem Relation Age of Onset  . Cancer Mother     liver  . Hypertension Father   . Diabetes Neg Hx   . Heart disease Neg Hx   . Stroke Neg Hx     No Known Allergies  Medication list has been reviewed and updated.  Current Outpatient Prescriptions on File Prior to Visit  Medication Sig Dispense Refill  . Calcium-Vitamin D-Vitamin K (VIACTIV PO) Take by mouth daily.    . chlorpheniramine-HYDROcodone (TUSSIONEX PENNKINETIC ER) 10-8 MG/5ML SUER Take 5 mLs by mouth at bedtime as needed for cough.  140 mL 0   No current facility-administered medications on file prior to visit.    Review of Systems:  As per HPI- otherwise negative.   Physical Examination: Filed Vitals:   10/12/15 1302 10/12/15 1305  BP: 152/88 157/85  Pulse: 74   Temp: 98.5 F (36.9 C)    Filed Vitals:   10/12/15 1302  Height: 5\' 8"  (1.727 m)  Weight: 165 lb (74.844 kg)   Body mass index is 25.09 kg/(m^2). Ideal Body Weight: Weight in (lb) to have BMI = 25: 164.1  GEN: WDWN, NAD, Non-toxic, A & O x 3, looks well but clearly has pain with leg exam  HEENT: Atraumatic, Normocephalic. Neck supple. No masses, No LAD. No bony tenderness in the c spine and normal cervical ROM  Ears and Nose: No external deformity. CV: RRR, No M/G/R. No JVD. No thrill. No extra heart sounds. PULM: CTA B, no wheezes, crackles, rhonchi. No retractions. No resp. distress. No accessory muscle use. EXTR: No c/c/e NEURO cannot bear weight, is in a WC PSYCH: Normally interactive. Conversant. Not depressed or anxious appearing.  Calm demeanor.  Left foot shows mild bruising and mild tenderness at the distal 1st MT.  Left ankle and knee negative  Right leg: she is very tender and shows some bruising and swelling at the proximal tib/ fib.  The pain does not seem to be in the knee joint itself however. Ankle and foot negative.  No numbness in the foot, norma circulation   Dg Knee 1-2 Views Right  10/12/2015  CLINICAL DATA:  Pt fell off a ladder today,left great toe bruising,right knee swelling,pain mid tib/fib EXAM: RIGHT KNEE - 1-2 VIEW COMPARISON:  None. FINDINGS: There is a nondisplaced, non comminuted fracture of the proximal tibia that extends from the articular surface of the lateral compartment obliquely to the lateral metaphysis. The joint is normally spaced and aligned. There are no other fractures. Moderate joint effusion is present. IMPRESSION: Nondisplaced fracture of the lateral tibial plateau with an associated moderate joint  effusion. No dislocation. Electronically Signed   By: Lajean Manes M.D.   On: 10/12/2015 13:48   Dg Tibia/fibula Right  10/12/2015  CLINICAL DATA:  Pt fell off a ladder today,left great toe bruising,right knee swelling,pain mid tib/fib EXAM: RIGHT TIBIA AND FIBULA - 2 VIEW COMPARISON:  None. FINDINGS: There is a fracture, without significant displacement or comminution, of the lateral tibial plateau. No other fractures. Knee and ankle joints are normally aligned. IMPRESSION: Nondisplaced fracture of the lateral tibial plateau. No other fractures. No dislocation. Electronically Signed   By: Lajean Manes M.D.   On: 10/12/2015 13:47   Dg Foot Complete Left  10/12/2015  CLINICAL DATA:  [place] off ladder with bruising of the left great toe EXAM: LEFT FOOT - COMPLETE 3+ VIEW COMPARISON:  None. FINDINGS: Tarsal-metatarsal alignment is normal. No fracture is seen. No erosion is noted. IMPRESSION: Negative. Electronically Signed   By: Ivar Drape M.D.   On: 10/12/2015 13:46    Assessment and Plan: Fall from ladder, initial encounter  Right leg pain - Plan: DG Tibia/Fibula Right, DG Knee 1-2 Views Right  Great toe pain, left - Plan: DG Foot Complete Left  Tibial plateau fracture. I do not have the appropriate supplies to treat this injury.  We appreciate care by D. Hudnall of sports med who is able to see this pt today. She will proceed upstairs to his office now   I did give her an rx for hydrocodone for pain.   Reminded her not to use this along with her tussionex cough syrup that she may use prn for cough and sinus sx   Signed Lamar Blinks, MD

## 2015-10-12 NOTE — Progress Notes (Signed)
Pre visit review using our clinic review tool, if applicable. No additional management support is needed unless otherwise documented below in the visit note. 

## 2015-10-12 NOTE — Patient Instructions (Signed)
You have a nondisplaced lateral tibial plateau fracture. Ice the area 15 minutes at a time up to every hour. Aleve 2 tabs twice a day with food OR ibuprofen 600mg  three times a day with food for pain and inflammation. Take the vicodin as needed for severe pain. ACE wrap, sleeve, or the knee brace you have at home for compression. Elevate above your heart as needed for swelling. Do not put any weight on this leg - use crutches at all times. Follow up with me in just under 2 weeks to reevaluate, repeat x-rays. Expect 6 weeks for this to heal.

## 2015-10-12 NOTE — Telephone Encounter (Signed)
Patient Name: Misty Vega  DOB: [birth year]    Initial Comment Caller states c/o fall of ladder, right knee injury.   Nurse Assessment      Guidelines    Guideline Title Affirmed Question Affirmed Notes       Final Disposition User   FINAL ATTEMPT MADE - message left Raphael Gibney, RN, Vera    Comments  Called primary number and left message. Will try secondary number.  Called primary and secondary numbers and left messages on both

## 2015-10-14 NOTE — Progress Notes (Signed)
PCP: Webb Silversmith, NP  Consultation requested by Dr. Lorelei Pont  Subjective:   HPI: Patient is a 65 y.o. female here for right leg injury.  Patient reports on 6/15 she was about a foot off the ground on a ladder - she fell backwards and twisted her right leg Immediate pain, inability to bear weight because of pain right leg - mainly in shin, knee. No prior injuries. Pain level 9/10, sharp. Worse when she tried to bear weight. No skin changes, numbness.  No past medical history on file.  Current Outpatient Prescriptions on File Prior to Visit  Medication Sig Dispense Refill  . Calcium-Vitamin D-Vitamin K (VIACTIV PO) Take by mouth daily.    . chlorpheniramine-HYDROcodone (TUSSIONEX PENNKINETIC ER) 10-8 MG/5ML SUER Take 5 mLs by mouth at bedtime as needed for cough. 140 mL 0  . HYDROcodone-acetaminophen (NORCO/VICODIN) 5-325 MG tablet Take 1-2 tablets by mouth every 6 (six) hours as needed. 30 tablet 0   No current facility-administered medications on file prior to visit.    No past surgical history on file.  No Known Allergies  Social History   Social History  . Marital Status: Married    Spouse Name: N/A  . Number of Children: N/A  . Years of Education: N/A   Occupational History  . Not on file.   Social History Main Topics  . Smoking status: Never Smoker   . Smokeless tobacco: Never Used  . Alcohol Use: No  . Drug Use: No  . Sexual Activity: Not on file   Other Topics Concern  . Not on file   Social History Narrative    Family History  Problem Relation Age of Onset  . Cancer Mother     liver  . Hypertension Father   . Diabetes Neg Hx   . Heart disease Neg Hx   . Stroke Neg Hx     BP 165/98 mmHg  Pulse 86  Ht 5\' 8"  (1.727 m)  Review of Systems: See HPI above.    Objective:  Physical Exam:  Gen: NAD, comfortable in exam room  Right leg: No gross deformity, bruising.  Effusion of right knee. TTP diffusely about knee, anteriorly over tibia.  No  other ankle, lower leg tenderness. FROM ankle.  ROM knee 0- 90 degrees. Collateral ligaments intact.  Negative ant/post drawers. Pain with mcmurrays and apleys.   NVI distally. No foot drop. Sensation intact to light touch.    Assessment & Plan:  1. Right knee injury - independently reviewed radiographs showing nondisplaced lateral tibial plateau fracture.  Icing, nsaids with norco as needed for severe pain.  ACE wrap, elevation.  No weight bearing.  F/u in 2 weeks, repeat radiographs.  Expect 6 weeks to heal.

## 2015-10-18 ENCOUNTER — Encounter: Payer: Self-pay | Admitting: Family Medicine

## 2015-10-18 DIAGNOSIS — S82141A Displaced bicondylar fracture of right tibia, initial encounter for closed fracture: Secondary | ICD-10-CM

## 2015-10-18 MED ORDER — HYDROCODONE-ACETAMINOPHEN 5-325 MG PO TABS: 1.0000 | ORAL_TABLET | Freq: Four times a day (QID) | ORAL | Status: DC | PRN

## 2015-10-18 NOTE — Telephone Encounter (Signed)
I will print the rx and have Dr. Frederico Hamman [name] take it with him to [facility] tomorrow for her to pick up

## 2015-10-20 DIAGNOSIS — S8991XA Unspecified injury of right lower leg, initial encounter: Secondary | ICD-10-CM | POA: Insufficient documentation

## 2015-10-20 NOTE — Assessment & Plan Note (Signed)
independently reviewed radiographs showing nondisplaced lateral tibial plateau fracture.  Icing, nsaids with norco as needed for severe pain.  ACE wrap, elevation.  No weight bearing.  F/u in 2 weeks, repeat radiographs.  Expect 6 weeks to heal.

## 2015-10-26 ENCOUNTER — Ambulatory Visit: Payer: BC Managed Care – PPO | Admitting: Family Medicine

## 2017-04-16 ENCOUNTER — Other Ambulatory Visit: Payer: Self-pay | Admitting: Obstetrics and Gynecology

## 2017-04-16 DIAGNOSIS — Z139 Encounter for screening, unspecified: Secondary | ICD-10-CM

## 2017-05-26 ENCOUNTER — Encounter: Payer: Self-pay | Admitting: Internal Medicine

## 2017-05-26 ENCOUNTER — Ambulatory Visit (INDEPENDENT_AMBULATORY_CARE_PROVIDER_SITE_OTHER): Payer: Medicare Other | Admitting: Internal Medicine

## 2017-05-26 VITALS — BP 138/86 | HR 66 | Temp 98.0°F | Ht 66.0 in | Wt 173.4 lb

## 2017-05-26 DIAGNOSIS — Z114 Encounter for screening for human immunodeficiency virus [HIV]: Secondary | ICD-10-CM | POA: Diagnosis not present

## 2017-05-26 DIAGNOSIS — Z1159 Encounter for screening for other viral diseases: Secondary | ICD-10-CM | POA: Diagnosis not present

## 2017-05-26 DIAGNOSIS — Z23 Encounter for immunization: Secondary | ICD-10-CM

## 2017-05-26 DIAGNOSIS — Z0000 Encounter for general adult medical examination without abnormal findings: Secondary | ICD-10-CM

## 2017-05-26 NOTE — Progress Notes (Signed)
HPI:  Pt presents to the clinic today for her Welcome to Medicare Exam.  No past medical history on file.  Current Outpatient Medications  Medication Sig Dispense Refill  . Calcium-Vitamin D-Vitamin K (VIACTIV PO) Take by mouth daily.    . chlorpheniramine-HYDROcodone (TUSSIONEX PENNKINETIC ER) 10-8 MG/5ML SUER Take 5 mLs by mouth at bedtime as needed for cough. 140 mL 0  . HYDROcodone-acetaminophen (NORCO/VICODIN) 5-325 MG tablet Take 1-2 tablets by mouth every 6 (six) hours as needed. 30 tablet 0   No current facility-administered medications for this visit.     No Known Allergies  Family History  Problem Relation Age of Onset  . Cancer Mother        liver  . Hypertension Father   . Diabetes Neg Hx   . Heart disease Neg Hx   . Stroke Neg Hx     Social History   Socioeconomic History  . Marital status: Married    Spouse name: Not on file  . Number of children: Not on file  . Years of education: Not on file  . Highest education level: Not on file  Social Needs  . Financial resource strain: Not on file  . Food insecurity - worry: Not on file  . Food insecurity - inability: Not on file  . Transportation needs - medical: Not on file  . Transportation needs - non-medical: Not on file  Occupational History  . Not on file  Tobacco Use  . Smoking status: Never Smoker  . Smokeless tobacco: Never Used  Substance and Sexual Activity  . Alcohol use: No    Alcohol/week: 0.0 oz  . Drug use: No  . Sexual activity: Not on file  Other Topics Concern  . Not on file  Social History Narrative  . Not on file    Hospitiliaztions: None  Health Maintenance:    Flu: never  Tetanus: > 10 years ago  Pneumovax: never  Prevnar: never  Zostavax: 02/2015   Shingrix: never  Mammogram: 01/2013, scheduled 05/2017  Pap Smear: 01/2012, scheduled 05/2017  Bone Density: > 5 years  Colon Screening: 10/2006  Eye Doctor: as needed  Dental Exam: yearly   Providers:   PCP: Webb Silversmith, NP-C    I have personally reviewed and have noted:  1. The patient's medical and social history 2. Their use of alcohol, tobacco or illicit drugs 3. Their current medications and supplements 4. The patient's functional ability including ADL's, fall risks, home safety risks and hearing or visual impairment. 5. Diet and physical activities 6. Evidence for depression or mood disorder  Subjective:   Review of Systems:   Constitutional: Denies fever, malaise, fatigue, headache or abrupt weight changes.  HEENT: Denies eye pain, eye redness, ear pain, ringing in the ears, wax buildup, runny nose, nasal congestion, bloody nose, or sore throat. Respiratory: Denies difficulty breathing, shortness of breath, cough or sputum production.   Cardiovascular: Denies chest pain, chest tightness, palpitations or swelling in the hands or feet.  Gastrointestinal: Denies abdominal pain, bloating, constipation, diarrhea or blood in the stool.  GU: Denies urgency, frequency, pain with urination, burning sensation, blood in urine, odor or discharge. Musculoskeletal: Denies decrease in range of motion, difficulty with gait, muscle pain or joint pain and swelling.  Skin: Denies redness, rashes, lesions or ulcercations.  Neurological: Denies dizziness, difficulty with memory, difficulty with speech or problems with balance and coordination.  Psych: Denies anxiety, depression, SI/HI.  No other specific complaints in a complete review of  systems (except as listed in HPI above).  Objective:  PE:   BP 138/86 (BP Location: Right Arm, Patient Position: Sitting, Cuff Size: Normal)   Pulse 66   Temp 98 F (36.7 C) (Oral)   Ht '5\' 6"'  (1.676 m)   Wt 173 lb 6.4 oz (78.7 kg)   SpO2 98%   BMI 27.99 kg/m   Wt Readings from Last 3 Encounters:  10/12/15 165 lb (74.8 kg)  09/04/15 169 lb 8 oz (76.9 kg)  02/20/15 172 lb (78 kg)    General: Appears their stated age, well developed, well nourished in  NAD. Skin: Warm, dry and intact. No rashes, lesions or ulcerations noted. HEENT: Head: normal shape and size; Eyes: sclera white, no icterus, conjunctiva pink, PERRLA and EOMs intact; Ears: Tm's gray and intact, normal light reflex; Throat/Mouth: Teeth present, mucosa pink and moist, no exudate, lesions or ulcerations noted.  Neck: Neck supple, trachea midline. No masses, lumps or thyromegaly present.  Cardiovascular: Normal rate and rhythm. S1,S2 noted.  No murmur, rubs or gallops noted. No JVD or BLE edema. No carotid bruits noted. Pulmonary/Chest: Normal effort and positive vesicular breath sounds. No respiratory distress. No wheezes, rales or ronchi noted.  Abdomen: Soft and nontender. Normal bowel sounds. No distention or masses noted. Liver, spleen and kidneys non palpable. Musculoskeletal: Normal range of motion. Strength 5/5 BUE/BLE. No signs of joint swelling.  Neurological: Alert and oriented. Cranial nerves II-XII grossly intact. Coordination normal.  Psychiatric: Mood and affect normal. Behavior is normal. Judgment and thought content normal.   EKG: Normal  BMET    Component Value Date/Time   NA 139 07/19/2013 0952   K 4.1 07/19/2013 0952   CL 104 07/19/2013 0952   CO2 28 07/19/2013 0952   GLUCOSE 97 07/19/2013 0952   BUN 13 07/19/2013 0952   CREATININE 0.9 07/19/2013 0952   CALCIUM 9.3 07/19/2013 0952   GFRNONAA >60 06/19/2008 1224   GFRAA  06/19/2008 1224    >60        The eGFR has been calculated using the MDRD equation. This calculation has not been validated in all clinical situations. eGFR's persistently <60 mL/min signify possible Chronic Kidney Disease.    Lipid Panel     Component Value Date/Time   CHOL 155 07/19/2013 0952   TRIG 78.0 07/19/2013 0952   HDL 55.50 07/19/2013 0952   CHOLHDL 3 07/19/2013 0952   VLDL 15.6 07/19/2013 0952   LDLCALC 84 07/19/2013 0952    CBC    Component Value Date/Time   WBC 4.5 07/19/2013 0952   RBC 4.40 07/19/2013  0952   HGB 12.4 07/19/2013 0952   HCT 37.1 07/19/2013 0952   PLT 300.0 07/19/2013 0952   MCV 84.2 07/19/2013 0952   MCHC 33.5 07/19/2013 0952   RDW 13.6 07/19/2013 0952   LYMPHSABS 0.4 (L) 06/19/2008 1224   MONOABS 0.6 06/19/2008 1224   EOSABS 0.3 06/19/2008 1224   BASOSABS 0.0 06/19/2008 1224    Hgb A1C No results found for: HGBA1C    Assessment and Plan:   Medicare Annual Wellness Visit:  Diet: She does eat lean meat. She consumes more fruits and veggies. She occassionally eats fried foods. She drinks mostly sweet tea, water and Diet Pepsi Physical activity:  None Depression/mood screen: Negative Hearing: Intact to whispered voice Visual acuity: Grossly normal  ADLs: Capable Fall risk: None Home safety: Good Cognitive evaluation: Intact to orientation, naming, recall and repetition EOL planning: No adv directives, full code/ I agree  Preventative Medicine: She declines flu shot or tetanus booster. Prevnar today. Pneumovax next year. Zostovax UTD. She declines shingrix. Pap and mammogram scheduled. She will discuss bone density with GYN. She declines colon cancer screening. Encouraged her to consume a balanced diet and exercise regimen. Advised her to see an eye doctor and dentist annually. Will check CBC, CMET, Lipid and Vit D today.   Next appointment: 1 year, Medicare Wellness Exam   Webb Silversmith, NP

## 2017-05-26 NOTE — Patient Instructions (Signed)
Health Maintenance for Postmenopausal Women Menopause is a normal process in which your reproductive ability comes to an end. This process happens gradually over a span of months to years, usually between the ages of 22 and 9. Menopause is complete when you have missed 12 consecutive menstrual periods. It is important to talk with your health care provider about some of the most common conditions that affect postmenopausal women, such as heart disease, cancer, and bone loss (osteoporosis). Adopting a healthy lifestyle and getting preventive care can help to promote your health and wellness. Those actions can also lower your chances of developing some of these common conditions. What should I know about menopause? During menopause, you may experience a number of symptoms, such as:  Moderate-to-severe hot flashes.  Night sweats.  Decrease in sex drive.  Mood swings.  Headaches.  Tiredness.  Irritability.  Memory problems.  Insomnia.  Choosing to treat or not to treat menopausal changes is an individual decision that you make with your health care provider. What should I know about hormone replacement therapy and supplements? Hormone therapy products are effective for treating symptoms that are associated with menopause, such as hot flashes and night sweats. Hormone replacement carries certain risks, especially as you become older. If you are thinking about using estrogen or estrogen with progestin treatments, discuss the benefits and risks with your health care provider. What should I know about heart disease and stroke? Heart disease, heart attack, and stroke become more likely as you age. This may be due, in part, to the hormonal changes that your body experiences during menopause. These can affect how your body processes dietary fats, triglycerides, and cholesterol. Heart attack and stroke are both medical emergencies. There are many things that you can do to help prevent heart disease  and stroke:  Have your blood pressure checked at least every 1-2 years. High blood pressure causes heart disease and increases the risk of stroke.  If you are 53-22 years old, ask your health care provider if you should take aspirin to prevent a heart attack or a stroke.  Do not use any tobacco products, including cigarettes, chewing tobacco, or electronic cigarettes. If you need help quitting, ask your health care provider.  It is important to eat a healthy diet and maintain a healthy weight. ? Be sure to include plenty of vegetables, fruits, low-fat dairy products, and lean protein. ? Avoid eating foods that are high in solid fats, added sugars, or salt (sodium).  Get regular exercise. This is one of the most important things that you can do for your health. ? Try to exercise for at least 150 minutes each week. The type of exercise that you do should increase your heart rate and make you sweat. This is known as moderate-intensity exercise. ? Try to do strengthening exercises at least twice each week. Do these in addition to the moderate-intensity exercise.  Know your numbers.Ask your health care provider to check your cholesterol and your blood glucose. Continue to have your blood tested as directed by your health care provider.  What should I know about cancer screening? There are several types of cancer. Take the following steps to reduce your risk and to catch any cancer development as early as possible. Breast Cancer  Practice breast self-awareness. ? This means understanding how your breasts normally appear and feel. ? It also means doing regular breast self-exams. Let your health care provider know about any changes, no matter how small.  If you are 40  or older, have a clinician do a breast exam (clinical breast exam or CBE) every year. Depending on your age, family history, and medical history, it may be recommended that you also have a yearly breast X-ray (mammogram).  If you  have a family history of breast cancer, talk with your health care provider about genetic screening.  If you are at high risk for breast cancer, talk with your health care provider about having an MRI and a mammogram every year.  Breast cancer (BRCA) gene test is recommended for women who have family members with BRCA-related cancers. Results of the assessment will determine the need for genetic counseling and BRCA1 and for BRCA2 testing. BRCA-related cancers include these types: ? Breast. This occurs in males or females. ? Ovarian. ? Tubal. This may also be called fallopian tube cancer. ? Cancer of the abdominal or pelvic lining (peritoneal cancer). ? Prostate. ? Pancreatic.  Cervical, Uterine, and Ovarian Cancer Your health care provider may recommend that you be screened regularly for cancer of the pelvic organs. These include your ovaries, uterus, and vagina. This screening involves a pelvic exam, which includes checking for microscopic changes to the surface of your cervix (Pap test).  For women ages 21-65, health care providers may recommend a pelvic exam and a Pap test every three years. For women ages 79-65, they may recommend the Pap test and pelvic exam, combined with testing for human papilloma virus (HPV), every five years. Some types of HPV increase your risk of cervical cancer. Testing for HPV may also be done on women of any age who have unclear Pap test results.  Other health care providers may not recommend any screening for nonpregnant women who are considered low risk for pelvic cancer and have no symptoms. Ask your health care provider if a screening pelvic exam is right for you.  If you have had past treatment for cervical cancer or a condition that could lead to cancer, you need Pap tests and screening for cancer for at least 20 years after your treatment. If Pap tests have been discontinued for you, your risk factors (such as having a new sexual partner) need to be  reassessed to determine if you should start having screenings again. Some women have medical problems that increase the chance of getting cervical cancer. In these cases, your health care provider may recommend that you have screening and Pap tests more often.  If you have a family history of uterine cancer or ovarian cancer, talk with your health care provider about genetic screening.  If you have vaginal bleeding after reaching menopause, tell your health care provider.  There are currently no reliable tests available to screen for ovarian cancer.  Lung Cancer Lung cancer screening is recommended for adults 69-62 years old who are at high risk for lung cancer because of a history of smoking. A yearly low-dose CT scan of the lungs is recommended if you:  Currently smoke.  Have a history of at least 30 pack-years of smoking and you currently smoke or have quit within the past 15 years. A pack-year is smoking an average of one pack of cigarettes per day for one year.  Yearly screening should:  Continue until it has been 15 years since you quit.  Stop if you develop a health problem that would prevent you from having lung cancer treatment.  Colorectal Cancer  This type of cancer can be detected and can often be prevented.  Routine colorectal cancer screening usually begins at  age 42 and continues through age 45.  If you have risk factors for colon cancer, your health care provider may recommend that you be screened at an earlier age.  If you have a family history of colorectal cancer, talk with your health care provider about genetic screening.  Your health care provider may also recommend using home test kits to check for hidden blood in your stool.  A small camera at the end of a tube can be used to examine your colon directly (sigmoidoscopy or colonoscopy). This is done to check for the earliest forms of colorectal cancer.  Direct examination of the colon should be repeated every  5-10 years until age 71. However, if early forms of precancerous polyps or small growths are found or if you have a family history or genetic risk for colorectal cancer, you may need to be screened more often.  Skin Cancer  Check your skin from head to toe regularly.  Monitor any moles. Be sure to tell your health care provider: ? About any new moles or changes in moles, especially if there is a change in a mole's shape or color. ? If you have a mole that is larger than the size of a pencil eraser.  If any of your family members has a history of skin cancer, especially at a young age, talk with your health care provider about genetic screening.  Always use sunscreen. Apply sunscreen liberally and repeatedly throughout the day.  Whenever you are outside, protect yourself by wearing long sleeves, pants, a wide-brimmed hat, and sunglasses.  What should I know about osteoporosis? Osteoporosis is a condition in which bone destruction happens more quickly than new bone creation. After menopause, you may be at an increased risk for osteoporosis. To help prevent osteoporosis or the bone fractures that can happen because of osteoporosis, the following is recommended:  If you are 46-71 years old, get at least 1,000 mg of calcium and at least 600 mg of vitamin D per day.  If you are older than age 55 but younger than age 65, get at least 1,200 mg of calcium and at least 600 mg of vitamin D per day.  If you are older than age 54, get at least 1,200 mg of calcium and at least 800 mg of vitamin D per day.  Smoking and excessive alcohol intake increase the risk of osteoporosis. Eat foods that are rich in calcium and vitamin D, and do weight-bearing exercises several times each week as directed by your health care provider. What should I know about how menopause affects my mental health? Depression may occur at any age, but it is more common as you become older. Common symptoms of depression  include:  Low or sad mood.  Changes in sleep patterns.  Changes in appetite or eating patterns.  Feeling an overall lack of motivation or enjoyment of activities that you previously enjoyed.  Frequent crying spells.  Talk with your health care provider if you think that you are experiencing depression. What should I know about immunizations? It is important that you get and maintain your immunizations. These include:  Tetanus, diphtheria, and pertussis (Tdap) booster vaccine.  Influenza every year before the flu season begins.  Pneumonia vaccine.  Shingles vaccine.  Your health care provider may also recommend other immunizations. This information is not intended to replace advice given to you by your health care provider. Make sure you discuss any questions you have with your health care provider. Document Released: 06/07/2005  Document Revised: 11/03/2015 Document Reviewed: 01/17/2015 Elsevier Interactive Patient Education  2018 [employer]  

## 2017-05-26 NOTE — Addendum Note (Signed)
Addended by: Virl Cagey on: 05/26/2017 11:53 AM   Modules accepted: Orders

## 2017-05-27 LAB — HEPATITIS C ANTIBODY
Hepatitis C Ab: NONREACTIVE
SIGNAL TO CUT-OFF: 0.01 (ref <1.00)

## 2017-05-27 LAB — HIV ANTIBODY (ROUTINE TESTING W REFLEX): HIV 1&2 Ab, 4th Generation: NONREACTIVE

## 2017-06-10 ENCOUNTER — Ambulatory Visit
Admission: RE | Admit: 2017-06-10 | Discharge: 2017-06-10 | Disposition: A | Discharge status: Home / Self Care | Payer: Medicare Other | Source: Ambulatory Visit | Attending: Obstetrics and Gynecology | Admitting: Obstetrics and Gynecology

## 2017-06-10 ENCOUNTER — Other Ambulatory Visit: Payer: Self-pay | Admitting: Nurse Practitioner

## 2017-06-10 ENCOUNTER — Other Ambulatory Visit (HOSPITAL_COMMUNITY)
Admission: RE | Admit: 2017-06-10 | Discharge: 2017-06-10 | Disposition: A | Discharge status: Home / Self Care | Payer: Medicare Other | Source: Ambulatory Visit | Attending: Nurse Practitioner | Admitting: Nurse Practitioner

## 2017-06-10 DIAGNOSIS — Z139 Encounter for screening, unspecified: Secondary | ICD-10-CM

## 2017-06-10 DIAGNOSIS — Z124 Encounter for screening for malignant neoplasm of cervix: Secondary | ICD-10-CM | POA: Diagnosis present

## 2017-06-12 ENCOUNTER — Ambulatory Visit: Payer: Medicare Other | Admitting: Family Medicine

## 2017-06-12 ENCOUNTER — Encounter: Payer: Self-pay | Admitting: Family Medicine

## 2017-06-12 DIAGNOSIS — J0100 Acute maxillary sinusitis, unspecified: Secondary | ICD-10-CM

## 2017-06-12 LAB — HM PAP SMEAR: HM Pap smear: NORMAL

## 2017-06-12 LAB — CYTOLOGY - PAP
DIAGNOSIS: NEGATIVE
HPV: NOT DETECTED

## 2017-06-12 MED ORDER — DOXYCYCLINE HYCLATE 100 MG PO TABS: 100.0000 mg | ORAL_TABLET | Freq: Two times a day (BID) | ORAL | 0 refills | Status: DC

## 2017-06-12 MED ORDER — AMOXICILLIN-POT CLAVULANATE 875-125 MG PO TABS: 1.0000 | ORAL_TABLET | Freq: Two times a day (BID) | ORAL | 0 refills | Status: DC

## 2017-06-12 MED ORDER — TRAMADOL HCL 50 MG PO TABS: 50.0000 mg | ORAL_TABLET | Freq: Three times a day (TID) | ORAL | 0 refills | Status: DC | PRN

## 2017-06-12 NOTE — Progress Notes (Signed)
Sx started yesterday AM.  Started with L facial swelling.  Not on ACE.  L upper tooth and L jaw pain.  She has normal dental care.  No fevers but some chills.  No vomiting. No diarrhea.  She isn't SOB or stuffy.  No ear pain.  No double vision.  No ST bu some drainage- minimal.  Some cough.  No lip swelling.  No tongue swelling.  5/10 constant pain.    She had similar sx years ago.    Meds, vitals, and allergies reviewed.   ROS: Per HPI unless specifically indicated in ROS section   nad ncat except for L maxillary area puffy locally.   TM wnl B Nasal exam stuffy No facial muscle weakness.   No lip or tongue swelling.   L max sinus area ttp No gingival abscess seen.   EOMI OP wnl Neck supple, no LA No stridor.  rrr ctab Ext well perfused.

## 2017-06-12 NOTE — Patient Instructions (Signed)
Start the antibiotics today.  Tramadol for pain, sedation caution.  Update [place] tomorrow AM.  If clearly worse then go to the ER.  Take care.  Glad to see you.

## 2017-06-13 ENCOUNTER — Telehealth: Payer: Self-pay | Admitting: Family Medicine

## 2017-06-13 NOTE — Assessment & Plan Note (Signed)
Apparently has significant left maxillary sinusitis.  She does not have double vision;her  extraocular movements are intact.  No trauma.  TMJ not tender.  No stridor.  No lip or tongue swelling.  This does not look like a periorbital cellulitis.  It does not appear to need imaging at this point.  Would double cover with Augmentin and doxycycline.  Can use tramadol for pain.  Strict ER precautions discussed with patient.  She understood.  Neither the patient nor I thought she needed to be in the emergency room currently.  She will update [place] tomorrow.  If not improving at all it would be reasonable to get CT done.  All discussed with patient.  All questions answered.  Discussed with PCP at the time of the office visit. Routed to PCP as FYI.

## 2017-06-13 NOTE — Telephone Encounter (Signed)
Please get update on patient. See OV note from yesterday.  Thanks.

## 2017-06-13 NOTE — Telephone Encounter (Signed)
Thanks for checking on patient.  Would continue as is.  Routed to PCP as FYI.

## 2017-06-13 NOTE — Telephone Encounter (Signed)
Patient says she is doing "SO" much better.  The swelling has went down a lot and the pain is much better.  Patient says thank you so much.

## 2017-06-18 ENCOUNTER — Encounter: Payer: Self-pay | Admitting: Internal Medicine

## 2017-08-27 ENCOUNTER — Ambulatory Visit
Admission: RE | Admit: 2017-08-27 | Discharge: 2017-08-27 | Disposition: A | Discharge status: Home / Self Care | Payer: Medicare Other | Source: Ambulatory Visit | Attending: Nurse Practitioner | Admitting: Nurse Practitioner

## 2017-08-27 ENCOUNTER — Other Ambulatory Visit: Payer: Self-pay | Admitting: Nurse Practitioner

## 2017-08-27 DIAGNOSIS — M8580 Other specified disorders of bone density and structure, unspecified site: Secondary | ICD-10-CM

## 2017-08-29 ENCOUNTER — Telehealth: Payer: Self-pay | Admitting: Internal Medicine

## 2017-08-29 NOTE — Telephone Encounter (Signed)
Copied from [place] [id number]. Topic: Quick Communication - See Telephone Encounter >> Aug 29, 2017  3:45 PM Vernona Rieger wrote: CRM for notification. See Telephone encounter for: 08/29/17.  She would like Webb Silversmith to know that she had a bone density test. 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 She is not sure if she needs to be on any medication for this  [phone]

## 2017-08-30 NOTE — Telephone Encounter (Signed)
Did she have a bone density? I see xray lumbar spine. She should contact [facility] to see if she needs treatment as they are the ones that ordered the xray.

## 2017-09-03 NOTE — Telephone Encounter (Signed)
Left detailed msg on VM per HIPAA  

## 2017-12-22 ENCOUNTER — Telehealth: Payer: Self-pay | Admitting: Internal Medicine

## 2017-12-22 NOTE — Telephone Encounter (Signed)
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 Pt was advised to let PCP know.

## 2017-12-22 NOTE — Telephone Encounter (Signed)
Noted. Will try to remember to address this at her next visit. If she could remind me, that would be wonderful.

## 2017-12-24 NOTE — Telephone Encounter (Signed)
Left detailed msg on VM per HIPAA Pt will need an OV or wait until annual exam end of Jan 2019

## 2018-01-14 ENCOUNTER — Ambulatory Visit: Payer: Self-pay

## 2018-01-14 NOTE — Telephone Encounter (Signed)
Incoming call from patient with complaint of numbness on the left side of arm from shoulder to elbow.  Patient states that she goes to the [facility]  And lifts weight.  Complains of hurting a  pulled   muscle.   Pain rated 5. Has not lifted weights this week.  Onset of pain was last Monday or Tuesday of last week.  States pain is constant.  Denies  Cardiac symptoms and neurologic symptoms. Provided  Care advice. Patient voiced understanding.  Appointment scheduled for   Thursday  01/15/18 with Webb Silversmith FNP. Patient voices understanding.                                                                                  Reason for Disposition . [1] Numbness or tingling in one or both hands AND [2] is a chronic symptom (recurrent or ongoing AND present > 4 weeks)  Answer Assessment - Initial Assessment Questions 1. SYMPTOM: "What is the main symptom you are concerned about?" (e.g., weakness, numbness)     Hurting rated 5 pulled muscle? 5 to 10  No weight s lifting havent lift this weeks 2. ONSET: "When did this start?" (minutes, hours, days; while sleeping)     Last Monday or Tues 3. LAST NORMAL: "When was the last time you were normal (no symptoms)?"      Sunday 4. PATTERN "Does this come and go, or has it been constant since it started?"  "Is it present now?"      Constant 5. CARDIAC SYMPTOMS: "Have you had any of the following symptoms: chest pain, difficulty breathing, palpitations?"   denies 6. NEUROLOGIC SYMPTOMS: "Have you had any of the following symptoms: headache, dizziness, vision loss, double vision, changes in speech, unsteady on your feet?"     denies 7. OTHER SYMPTOMS: "Do you have any other symptoms?"     no 8. PREGNANCY: "Is there any chance you are pregnant?" "When was your last menstrual period?"     Na  Protocols used: NEUROLOGIC DEFICIT-A-AH

## 2018-01-15 ENCOUNTER — Emergency Department (HOSPITAL_COMMUNITY): Payer: Medicare Other

## 2018-01-15 ENCOUNTER — Ambulatory Visit: Payer: Medicare Other | Admitting: Internal Medicine

## 2018-01-15 ENCOUNTER — Emergency Department (HOSPITAL_COMMUNITY)
Admission: EM | Admit: 2018-01-15 | Discharge: 2018-01-15 | Disposition: A | Discharge status: Home / Self Care | Payer: Medicare Other | Attending: Emergency Medicine | Admitting: Emergency Medicine

## 2018-01-15 DIAGNOSIS — R110 Nausea: Secondary | ICD-10-CM | POA: Diagnosis not present

## 2018-01-15 DIAGNOSIS — Z79899 Other long term (current) drug therapy: Secondary | ICD-10-CM | POA: Diagnosis not present

## 2018-01-15 DIAGNOSIS — M542 Cervicalgia: Secondary | ICD-10-CM | POA: Diagnosis present

## 2018-01-15 DIAGNOSIS — M792 Neuralgia and neuritis, unspecified: Secondary | ICD-10-CM | POA: Diagnosis not present

## 2018-01-15 DIAGNOSIS — R002 Palpitations: Secondary | ICD-10-CM | POA: Insufficient documentation

## 2018-01-15 DIAGNOSIS — R0789 Other chest pain: Secondary | ICD-10-CM | POA: Insufficient documentation

## 2018-01-15 DIAGNOSIS — R42 Dizziness and giddiness: Secondary | ICD-10-CM | POA: Diagnosis not present

## 2018-01-15 LAB — BASIC METABOLIC PANEL
ANION GAP: 11 (ref 5–15)
BUN: 10 mg/dL (ref 8–23)
CO2: 24 mmol/L (ref 22–32)
Calcium: 8.8 mg/dL — ABNORMAL LOW (ref 8.9–10.3)
Chloride: 105 mmol/L (ref 98–111)
Creatinine, Ser: 0.94 mg/dL (ref 0.44–1.00)
GFR calc Af Amer: >60 mL/min (ref >60)
GLUCOSE: 103 mg/dL — AB (ref 70–99)
Potassium: 3.9 mmol/L (ref 3.5–5.1)
Sodium: 140 mmol/L (ref 135–145)

## 2018-01-15 LAB — I-STAT TROPONIN, ED
TROPONIN I, POC: 0 ng/mL (ref 0.00–0.08)
Troponin i, poc: 0 ng/mL (ref 0.00–0.08)

## 2018-01-15 LAB — CBC
HCT: 34.5 % — ABNORMAL LOW (ref 36.0–46.0)
Hemoglobin: 10.7 g/dL — ABNORMAL LOW (ref 12.0–15.0)
MCH: 26.4 pg (ref 26.0–34.0)
MCHC: 31 g/dL (ref 30.0–36.0)
MCV: 85 fL (ref 78.0–100.0)
PLATELETS: 236 10*3/uL (ref 150–400)
RBC: 4.06 MIL/uL (ref 3.87–5.11)
RDW: 13.7 % (ref 11.5–15.5)
WBC: 4.5 10*3/uL (ref 4.0–10.5)

## 2018-01-15 MED ORDER — IOPAMIDOL (ISOVUE-370) INJECTION 76%
50.0000 mL | Freq: Once | INTRAVENOUS | Status: AC
  Administered 2018-01-15: 50 mL via INTRAVENOUS

## 2018-01-15 MED ORDER — TRAMADOL HCL 50 MG PO TABS: 50.0000 mg | ORAL_TABLET | Freq: Three times a day (TID) | ORAL | 0 refills | Status: DC | PRN

## 2018-01-15 MED ORDER — PREDNISONE 20 MG PO TABS: ORAL_TABLET | ORAL | 0 refills | Status: DC

## 2018-01-15 MED ORDER — MORPHINE SULFATE (PF) 4 MG/ML IV SOLN
4.0000 mg | Freq: Once | INTRAVENOUS | Status: AC
  Administered 2018-01-15: 4 mg via INTRAVENOUS
  Filled 2018-01-15: qty 1

## 2018-01-15 NOTE — ED Triage Notes (Signed)
Left arm pain x1 week - onset of left neck pain yesterday with nausea - nausea has resolved; however, pain persists; additionally, reports had xray had dentist office which reportedly revealed evidence of possible atherosclerosis to left neck

## 2018-01-15 NOTE — ED Provider Notes (Signed)
[facility] EMERGENCY DEPARTMENT Provider Note   CSN: [id number] Arrival date & time: 01/15/18  5956     History   Chief Complaint Chief Complaint  Patient presents with  . Neck Pain    HPI Misty Vega is a 67 y.o. female.  The history is provided by the patient and medical records. No language interpreter was used.  Neck Pain       67 year old female without significant past medical hx here with c/o L arm pain and L neck pain.  Pt brought here via EMS from home.  Patient mention for the past week she has had persistent pain primarily to the left side of her neck that radiates to her left arm.  Pain is described as such sharp sensation, worsening with movement and with palpation.  Pain is moderate in severity however this morning while sitting she also developed pain to the left side of her chest that concerns her.  Pain lasting for approximately 10 to 15 minutes, sharp with associated lightheadedness, heart racing, nausea without vomiting or blurry vision.  There is no slight shortness of breath or diaphoresis or weakness.  EMS did arrive, patient received 4 baby aspirin and sublingual nitro and now her chest pain has resolved.  She still endorse pain to her neck and arm.  She does workout at the [facility] but denies any new strenuous activity.  No report of fever, chills, productive cough, hemoptysis, back pain or abdominal pain.  She is right-hand dominant.    No past medical history on file.  Patient Active Problem List   Diagnosis Date Noted  . Acute maxillary sinusitis 11/04/2008    No past surgical history on file.   OB History   None      Home Medications    Prior to Admission medications   Medication Sig Start Date End Date Taking? Authorizing Provider  amoxicillin-clavulanate (AUGMENTIN) 875-125 MG tablet Take 1 tablet by mouth 2 (two) times daily. 06/12/17   Tonia Ghent, MD  Calcium-Vitamin D-Vitamin K (VIACTIV PO) Take by mouth daily.     [provider]  doxycycline (VIBRA-TABS) 100 MG tablet Take 1 tablet (100 mg total) by mouth 2 (two) times daily. 06/12/17   Tonia Ghent, MD  traMADol (ULTRAM) 50 MG tablet Take 1 tablet (50 mg total) by mouth every 8 (eight) hours as needed. 06/12/17   Tonia Ghent, MD    Family History Family History  Problem Relation Age of Onset  . Cancer Mother        liver  . Hypertension Father   . Diabetes Neg Hx   . Heart disease Neg Hx   . Stroke Neg Hx     Social History Social History   Tobacco Use  . Smoking status: Never Smoker  . Smokeless tobacco: Never Used  Substance Use Topics  . Alcohol use: No    Alcohol/week: 0.0 standard drinks  . Drug use: No     Allergies   Patient has no known allergies.   Review of Systems Review of Systems  Musculoskeletal: Positive for neck pain.  All other systems reviewed and are negative.    Physical Exam Updated Vital Signs BP (!) 146/84 (BP Location: Right Arm)   Pulse 66   Temp 98.2 F (36.8 C) (Oral)   Resp 20   SpO2 97%   Physical Exam  Constitutional: She appears well-developed and well-nourished. No distress.  HENT:  Head: Atraumatic.  Eyes:  Pupils are equal, round, and reactive to light. Conjunctivae and EOM are normal.  Neck: Normal range of motion. Neck supple. No tracheal deviation present. No thyromegaly present.  Tenderness along Left SCM without bruits or overlying skin changes.  No significant cervical midline spine tenderness  Cardiovascular: Normal rate, regular rhythm and intact distal pulses.  Pulmonary/Chest: Effort normal and breath sounds normal. She exhibits no tenderness.  Abdominal: Soft. Bowel sounds are normal. She exhibits no distension. There is no tenderness.  Musculoskeletal: She exhibits tenderness (L arm: tenderness along L bicipital groove without skin changes.  radial pulse 2+, normal grip strength and brisk cap refill. ). She exhibits no edema.  Neurological: She is  alert.  Skin: No rash noted.  Psychiatric: She has a normal mood and affect.  Nursing note and vitals reviewed.    ED Treatments / Results  Labs (all labs ordered are listed, but only abnormal results are displayed) Labs Reviewed  CBC - Abnormal; Notable for the following components:      Result Value   Hemoglobin 10.7 (*)    HCT 34.5 (*)    All other components within normal limits  BASIC METABOLIC PANEL - Abnormal; Notable for the following components:   Glucose, Bld 103 (*)    Calcium 8.8 (*)    All other components within normal limits  I-STAT TROPONIN, ED  I-STAT TROPONIN, ED    EKG EKG Interpretation  Date/Time:  Thursday January 15 2018 09:33:35 EDT Ventricular Rate:  60 PR Interval:    QRS Duration: 92 QT Interval:  417 QTC Calculation: 417 R Axis:   14 Text Interpretation:  Sinus rhythm Abnormal R-wave progression, early transition Minimal ST elevation, anterior leads Confirmed by Nat Christen [phone]) on 01/15/2018 10:48:17 AM   ED ECG REPORT   Date: 01/15/2018  Rate: 60  Rhythm: normal sinus rhythm  QRS Axis: normal  Intervals: normal  ST/T Wave abnormalities: minimal ST elevation, anterior leads  Conduction Disutrbances:none  Narrative Interpretation:   Old EKG Reviewed: unchanged  I have personally reviewed the EKG tracing and agree with the computerized printout as noted.   Radiology Dg Chest 2 View  Result Date: 01/15/2018 CLINICAL DATA:  Chest and arm pain. EXAM: CHEST - 2 VIEW COMPARISON:  06/19/2008. FINDINGS: Mediastinum hilar structures normal. Borderline cardiomegaly with normal pulmonary vascularity. No focal infiltrate. No pleural effusion or pneumothorax. No acute bony abnormality. IMPRESSION: 1.  Borderline cardiomegaly.  No pulmonary venous congestion. 2.  No acute pulmonary disease. Electronically Signed   By: Marcello Moores  Register   On: 01/15/2018 11:08   Ct Angio Neck W And/or Wo Contrast  Result Date: 01/15/2018 CLINICAL DATA:   Left-sided neck pain.  Rule out dissection. EXAM: CT ANGIOGRAPHY NECK TECHNIQUE: Multidetector CT imaging of the neck was performed using the standard protocol during bolus administration of intravenous contrast. Multiplanar CT image reconstructions and MIPs were obtained to evaluate the vascular anatomy. Carotid stenosis measurements (when applicable) are obtained utilizing NASCET criteria, using the distal internal carotid diameter as the denominator. CONTRAST:  50 cc Isovue 370 intravenous COMPARISON:  Head CT from 06/19/2008 FINDINGS: Aortic arch: Normal Right carotid system: Vessels are smooth and widely patent. No atheromatous changes. Left carotid system: Vessels are smooth and widely patent. No atheromatous changes. Vertebral arteries: No proximal subclavian stenosis. There is a left dominant vertebral artery. Skeleton: Disc degeneration most advanced at C4-5 and C5-6 where posterior disc osteophyte complexes likely encroach on the ventral cord. Disc height loss and uncovertebral spurring causes  foraminal narrowing with advanced impingement on the right at C3-4, left C4-5, bilaterally at C5-6, and bilaterally at C6-7. Other neck: No evidence of inflammation or mass Upper chest: Negative IMPRESSION: 1. Negative for dissection or other arterial cause of neck pain. 2. 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 Electronically Signed   By: Monte Fantasia M.D.   On: 01/15/2018 11:48    Procedures Procedures (including critical care time)  Medications Ordered in ED Medications  morphine 4 MG/ML injection 4 mg (4 mg Intravenous Given 01/15/18 1131)  iopamidol (ISOVUE-370) 76 % injection 50 mL (50 mLs Intravenous Contrast Given 01/15/18 1133)     Initial Impression / Assessment and Plan / ED Course  I have reviewed the triage vital signs and the nursing notes.  Pertinent labs & imaging results that were available during my care of the  patient were reviewed by me and considered in my medical decision making (see chart for details).     BP (!) 146/84 (BP Location: Right Arm)   Pulse 66   Temp 98.2 F (36.8 C) (Oral)   Resp 20   SpO2 97%    Final Clinical Impressions(s) / ED Diagnoses   Final diagnoses:  Radicular pain in left arm    ED Discharge Orders         Ordered    traMADol (ULTRAM) 50 MG tablet  Every 8 hours PRN     01/15/18 1329    predniSONE (DELTASONE) 20 MG tablet     01/15/18 1329         10:45 AM Patient here with pain to the left side of neck radiates to left arm which is been an ongoing issue for the past week.  Pain is reproducible on exam and likely to be musculoskeletal.  However, patient was told that she has calcification of left neck vessels on a Panorex obtained by her dentist 2 months prior.  She denies any significant cardiac history.  On exam she does not have any weakness or numbness but does have reproducible pain primarily to her left mastoid and her left occipital groove.  Given her age, will obtain a CT angiogram to rule out dissection.  Patient also endorsed left-sided chest pain.  She has a heart score of 2 which was low risk for Mace, she is chest pain-free, initial EKG and troponin unremarkable.  Will obtain delta troponin.  1:32 PM Normal delta troponin.  Patient remained symptom-free. CT angiogram obtained showing narrowing of the foramina and evidence of degenerative changes of the spine as well as nerve impingement which explains patient neck and arm pain.  Will discharge home with steroid and tramadol for her symptoms.  Encouraged outpatient follow-up with orthopedic for further care.  Also encouraged patient to follow-up with PCP as well as cardiology for further evaluation of her chest pain. Care discussed with Dr. Lacinda Axon.   Domenic Moras, PA-C 01/15/18 1334    Nat Christen, MD 01/16/18 1215

## 2018-01-15 NOTE — Discharge Instructions (Signed)
You have been evaluated for your neck and arm pain.  This is likely due to an impinged nerve causing pain.  Take steroid, and tramadol as prescribed.  You have also been evaluated for your chest pain.  No evidence of heart attack on today's examination.  Please follow-up closely with your primary care doctor and requests to have a cardiology referral for further evaluation.  Also following up with an orthopedist for your neck pain.

## 2018-01-22 ENCOUNTER — Ambulatory Visit: Payer: Medicare Other | Admitting: Internal Medicine

## 2018-01-22 ENCOUNTER — Encounter: Payer: Self-pay | Admitting: Internal Medicine

## 2018-01-22 VITALS — BP 132/84 | HR 76 | Temp 98.0°F | Wt 166.0 lb

## 2018-01-22 DIAGNOSIS — M5412 Radiculopathy, cervical region: Secondary | ICD-10-CM | POA: Diagnosis not present

## 2018-01-22 DIAGNOSIS — M7522 Bicipital tendinitis, left shoulder: Secondary | ICD-10-CM

## 2018-01-22 DIAGNOSIS — M7552 Bursitis of left shoulder: Secondary | ICD-10-CM | POA: Diagnosis not present

## 2018-01-22 MED ORDER — TRAMADOL HCL 50 MG PO TABS: 50.0000 mg | ORAL_TABLET | Freq: Three times a day (TID) | ORAL | 0 refills | Status: DC | PRN

## 2018-01-22 MED ORDER — NAPROXEN 500 MG PO TABS: 500.0000 mg | ORAL_TABLET | Freq: Two times a day (BID) | ORAL | 0 refills | Status: DC

## 2018-01-22 NOTE — Progress Notes (Signed)
Subjective:    Patient ID: Misty Vega, female    DOB: January 21, 1951, 67 y.o.   MRN: [id number]  HPI  Pt presents to the clinic today for ER follow up. She went to the ER 9/19 with left sided chest pain, neck pain and left arm pain. Symptoms had started about 1 week prior, were intermittent and usually resolved after 15-20 minutes. She was given 4 baby Aspirin and a SL Nitro in route via EMS. ECG showed minimal ST elevation but troponin's were negative. They obtained CTA of the neck which showed:  IMPRESSION: 1. Negative for dissection or other arterial cause of neck pain. 2. 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  She was diagnosed with cervical radiculitis, treated with Prednisone and Tramadol. She was discharged and advised to follow up with her PCP. Since discharge, She continues to have some left arm pain, but is better with Prednisone and Tramadol. She denies numbness, tingling or weakness in the left arm. She does work out at [employer] about 5 days per week, but has rested this week with some improvement.  Review of Systems      No past medical history on file.  Current Outpatient Medications  Medication Sig Dispense Refill  . Calcium-Vitamin D-Vitamin K (VIACTIV PO) Take 1 capsule by mouth daily.     . Multiple Vitamin (MULTIVITAMIN WITH MINERALS) TABS tablet Take 1 tablet by mouth daily.    . predniSONE (DELTASONE) 20 MG tablet 3 tabs po day one, then 2 tabs daily x 4 days 11 tablet 0  . traMADol (ULTRAM) 50 MG tablet Take 1 tablet (50 mg total) by mouth every 8 (eight) hours as needed for moderate pain. 15 tablet 0   No current facility-administered medications for this visit.     No Known Allergies  Family History  Problem Relation Age of Onset  . Cancer Mother        liver  . Hypertension Father   . Diabetes Neg Hx   . Heart disease Neg Hx   . Stroke Neg Hx     Social History   Socioeconomic  History  . Marital status: Married    Spouse name: Not on file  . Number of children: Not on file  . Years of education: Not on file  . Highest education level: Not on file  Occupational History  . Not on file  Social Needs  . Financial resource strain: Not on file  . Food insecurity:    Worry: Not on file    Inability: Not on file  . Transportation needs:    Medical: Not on file    Non-medical: Not on file  Tobacco Use  . Smoking status: Never Smoker  . Smokeless tobacco: Never Used  Substance and Sexual Activity  . Alcohol use: No    Alcohol/week: 0.0 standard drinks  . Drug use: No  . Sexual activity: Not on file  Lifestyle  . Physical activity:    Days per week: Not on file    Minutes per session: Not on file  . Stress: Not on file  Relationships  . Social connections:    Talks on phone: Not on file    Gets together: Not on file    Attends religious service: Not on file    Active member of club or organization: Not on file    Attends meetings of clubs or organizations: Not on file  Relationship status: Not on file  . Intimate partner violence:    Fear of current or ex partner: Not on file    Emotionally abused: Not on file    Physically abused: Not on file    Forced sexual activity: Not on file  Other Topics Concern  . Not on file  Social History Narrative  . Not on file     Constitutional: Denies fever, malaise, fatigue, headache or abrupt weight changes.  Respiratory: Denies difficulty breathing, shortness of breath, cough or sputum production.   Cardiovascular: Denies chest pain, chest tightness, palpitations or swelling in the hands or feet.  Gastrointestinal: Denies abdominal pain, bloating, constipation, diarrhea or blood in the stool.  Musculoskeletal: Pt reports left arm pain. Denies decrease in range of motion, difficulty with gait,  or joint swelling.  Skin: Denies redness, rashes, lesions or ulcercations.  Neurological: Denies dizziness,  difficulty with memory, difficulty with speech or problems with balance and coordination. numbness, tingling, weakness of left lower extremity.    No other specific complaints in a complete review of systems (except as listed in HPI above).  Objective:   Physical Exam  BP 132/84   Pulse 76   Temp 98 F (36.7 C) (Oral)   Wt 166 lb (75.3 kg)   SpO2 98%   BMI 26.79 kg/m  Wt Readings from Last 3 Encounters:  01/22/18 166 lb (75.3 kg)  06/12/17 173 lb 4 oz (78.6 kg)  05/26/17 173 lb 6.4 oz (78.7 kg)    General: Appears her stated age, well developed, well nourished in NAD. Cardiovascular: Normal rate and rhythm. S1,S2 noted.  No murmur, rubs or gallops noted.  Pulmonary/Chest: Normal effort and positive vesicular breath sounds. No respiratory distress. No wheezes, rales or ronchi noted.  Musculoskeletal: Normal flexion, extension and rotation of the cervical spine. No bony tenderness noted over the spine. Normal external rotation of the left shoulder. Pain with internal rotation of the left shoulder. Pain with palpation over the anterior proximal biceps tendon and subacromial bursa. Strength 5/5 BUE/BLE. Neurological: Alert and oriented. Sensation intact to BUE/BLE.    BMET    Component Value Date/Time   NA 140 01/15/2018 0939   K 3.9 01/15/2018 0939   CL 105 01/15/2018 0939   CO2 24 01/15/2018 0939   GLUCOSE 103 (H) 01/15/2018 0939   BUN 10 01/15/2018 0939   CREATININE 0.94 01/15/2018 0939   CALCIUM 8.8 (L) 01/15/2018 0939   GFRNONAA >60 01/15/2018 0939   GFRAA >60 01/15/2018 0939    Lipid Panel     Component Value Date/Time   CHOL 155 07/19/2013 0952   TRIG 78.0 07/19/2013 0952   HDL 55.50 07/19/2013 0952   CHOLHDL 3 07/19/2013 0952   VLDL 15.6 07/19/2013 0952   LDLCALC 84 07/19/2013 0952    CBC    Component Value Date/Time   WBC 4.5 01/15/2018 0939   RBC 4.06 01/15/2018 0939   HGB 10.7 (L) 01/15/2018 0939   HCT 34.5 (L) 01/15/2018 0939   PLT 236 01/15/2018  0939   MCV 85.0 01/15/2018 0939   MCH 26.4 01/15/2018 0939   MCHC 31.0 01/15/2018 0939   RDW 13.7 01/15/2018 0939   LYMPHSABS 0.4 (L) 06/19/2008 1224   MONOABS 0.6 06/19/2008 1224   EOSABS 0.3 06/19/2008 1224   BASOSABS 0.0 06/19/2008 1224    Hgb A1C No results found for: HGBA1C          Assessment & Plan:   ER Follow Up for Cervical  Radiculitis, Left Biceps Tendonitis, Left Subacromial Bursitis:  ER notes, labs and imaging reviewed She will take her last Prednisone tomorrow After done with Prednisone, eRx for Naproxen 500 mg BID with meals Tramadol refilled today Encouraged rest, stretching If worse, consider PT eval   Return precautions discussed Webb Silversmith, NP

## 2018-01-22 NOTE — Patient Instructions (Signed)
Biceps Tendon Tendinitis (Distal) Distal biceps tendon tendinitis is inflammation of the distal biceps tendon. The distal biceps tendon is a strong cord of tissue that connects the biceps muscle, on the front of the upper arm, to a bone (radius) in the elbow. Distal biceps tendon tendinitis can interfere with the ability to bend the elbow and turn the hand palm-up (supination). This condition is usually caused by overusing the elbow joint and the biceps muscle, and it usually heals within 6 weeks. Distal biceps tendon tendinitis may include a grade 1 or grade 2 strain of the tendon. A grade 1 strain is mild, and it involves a slight pull of the tendon without any stretching or noticeable tearing of the tendon. There is usually no loss of biceps muscle strength. A grade 2 strain is moderate, and it involves a small tear in the tendon. The tendon is stretched, and biceps muscle strength is usually decreased. What are the causes? This condition may be caused by:  A sudden increase in the frequency or intensity of activity that involves the elbow and the biceps muscle.  Overuse of the biceps muscle. This can happen when you do the same movements over and over, such as: ? Supination. ? Forceful straightening (hyperextension) of the elbow. ? Bending of the elbow.  A direct, forceful hit or injury (trauma) to the elbow. This is rare.  What increases the risk? The following factors may make you more likely to develop this condition:  Playing contact sports.  Playing sports that involve throwing and overhead movements, including racket sports, gymnastics, weight lifting, or bodybuilding.  Doing physical labor.  Having poor strength and flexibility of the arm and shoulder.  Having injured other parts of the elbow.  What are the signs or symptoms? Symptoms of this condition may include:  Pain and inflammation in the front of the elbow. Pain may get worse during certain movements, such  as: ? Supination. ? Bending the elbow. ? Lifting or carrying objects. ? Throwing.  A feeling of warmth in the front of the elbow.  A crackling sound (crepitation) when you move or touch the elbow or the upper arm.  In some cases, symptoms may return (recur) after treatment, and they may be long-lasting (chronic). How is this diagnosed? This condition is diagnosed based on your symptoms, your medical history, and a physical exam. You may have tests, including X-rays or MRIs. Your health care provider may test your range of motion by having you do arm movements. How is this treated? This condition is treated by resting and icing the injured area, and by doing physical therapy exercises. Depending on the severity of your condition, treatment may also include:  Medicines to help relieve pain and inflammation.  Ultrasound therapy. This is the application of sound waves to the injured area.  Follow these instructions at home: Managing pain, stiffness, and swelling  If directed, put ice on the injured area: ? Put ice in a plastic bag. ? Place a towel between your skin and the bag. ? Leave the ice on for 20 minutes, 2-3 times a day.  Move your fingers often to avoid stiffness and to lessen swelling.  Raise (elevate) the injured area above the level of your heart while you are sitting or lying down.  If directed, apply heat to the affected area before you exercise. Use the heat source that your health care provider recommends, such as a moist heat pack or a heating pad. ? Place a towel between   your skin and the heat source. ? Leave the heat on for 20-30 minutes. ? Remove the heat if your skin turns bright red. This is especially important if you are unable to feel pain, heat, or cold. You may have a greater risk of getting burned. Activity  Return to your normal activities as told by your health care provider. Ask your health care provider what activities are safe for you.  Do not lift  anything that is heavier than 10 lb (4.5 kg) until your health care provider tells you that it is safe.  Avoid activities that cause pain or make your condition worse.  Do exercises as told by your health care provider. General instructions  Take over-the-counter and prescription medicines only as told by your health care provider.  Do not drive or operate heavy machinery while taking prescription pain medicines.  Keep all follow-up visits as told by your health care provider. This is important. How is this prevented?  Warm up and stretch before being active.  Cool down and stretch after being active.  Give your body time to rest between periods of activity.  Make sure to use equipment that fits you.  Be safe and responsible while being active to avoid falls.  Do at least 150 minutes of moderate-intensity exercise each week, such as brisk walking or water aerobics.  Maintain physical fitness, including: ? Strength. ? Flexibility. ? Cardiovascular fitness. ? Endurance. Contact a health care provider if:  You have symptoms that get worse or do not get better after 2 weeks of treatment.  You develop new symptoms. Get help right away if:  You develop severe pain. This information is not intended to replace advice given to you by your health care provider. Make sure you discuss any questions you have with your health care provider. Document Released: 04/15/2005 Document Revised: 12/21/2015 Document Reviewed: 03/24/2015 [employer]vier Interactive Patient Education  2018 [employer]  

## 2018-03-05 ENCOUNTER — Ambulatory Visit (INDEPENDENT_AMBULATORY_CARE_PROVIDER_SITE_OTHER): Payer: Medicare Other

## 2018-03-05 DIAGNOSIS — Z23 Encounter for immunization: Secondary | ICD-10-CM

## 2018-06-10 ENCOUNTER — Ambulatory Visit: Payer: Medicare Other | Admitting: Primary Care

## 2018-06-10 VITALS — BP 146/84 | HR 76 | Temp 98.1°F | Ht 66.0 in | Wt 172.0 lb

## 2018-06-10 DIAGNOSIS — J309 Allergic rhinitis, unspecified: Secondary | ICD-10-CM

## 2018-06-10 NOTE — Patient Instructions (Signed)
Your symptoms are related to allergies.   Start Zyrtec at bedtime once nightly.  Nasal Congestion/Ear Pressure/Sinus Pressure: Try using Flonase (fluticasone) nasal spray. Instill 1 spray in each nostril twice daily.   You can take Ibuprofen or Tylenol as needed for headaches.  Please update [place] if you develop fevers of 101 or higher or feel worse in one week.  It was a pleasure meeting you!  Allergic Rhinitis, Adult Allergic rhinitis is an allergic reaction that affects the mucous membrane inside the nose. It causes sneezing, a runny or stuffy nose, and the feeling of mucus going down the back of the throat (postnasal drip). Allergic rhinitis can be mild to severe. There are two types of allergic rhinitis:  Seasonal. This type is also called hay fever. It happens only during certain seasons.  Perennial. This type can happen at any time of the year. What are the causes? This condition happens when the body's defense system (immune system) responds to certain harmless substances called allergens as though they were germs.  Seasonal allergic rhinitis is triggered by pollen, which can come from grasses, trees, and weeds. Perennial allergic rhinitis may be caused by:  House dust mites.  Pet dander.  Mold spores. What are the signs or symptoms? Symptoms of this condition include:  Sneezing.  Runny or stuffy nose (nasal congestion).  Postnasal drip.  Itchy nose.  Tearing of the eyes.  Trouble sleeping.  Daytime sleepiness. How is this diagnosed? This condition may be diagnosed based on:  Your medical history.  A physical exam.  Tests to check for related conditions, such as: ? Asthma. ? Pink eye. ? Ear infection. ? Upper respiratory infection.  Tests to find out which allergens trigger your symptoms. These may include skin or blood tests. How is this treated? There is no cure for this condition, but treatment can help control symptoms. Treatment may  include:  Taking medicines that block allergy symptoms, such as antihistamines. Medicine may be given as a shot, nasal spray, or pill.  Avoiding the allergen.  Desensitization. This treatment involves getting ongoing shots until your body becomes less sensitive to the allergen. This treatment may be done if other treatments do not help.  If taking medicine and avoiding the allergen does not work, new, stronger medicines may be prescribed. Follow these instructions at home:  Find out what you are allergic to. Common allergens include smoke, dust, and pollen.  Avoid the things you are allergic to. These are some things you can do to help avoid allergens: ? Replace carpet with wood, tile, or vinyl flooring. Carpet can trap dander and dust. ? Do not smoke. Do not allow smoking in your home. ? Change your heating and air conditioning filter at least once a month. ? During allergy season:  Keep windows closed as much as possible.  Plan outdoor activities when pollen counts are lowest. This is usually during the evening hours.  When coming indoors, change clothing and shower before sitting on furniture or bedding.  Take over-the-counter and prescription medicines only as told by your health care provider.  Keep all follow-up visits as told by your health care provider. This is important. Contact a health care provider if:  You have a fever.  You develop a persistent cough.  You make whistling sounds when you breathe (you wheeze).  Your symptoms interfere with your normal daily activities. Get help right away if:  You have shortness of breath. Summary  This condition can be managed by taking  medicines as directed and avoiding allergens.  Contact your health care provider if you develop a persistent cough or fever.  During allergy season, keep windows closed as much as possible. This information is not intended to replace advice given to you by your health care provider. Make  sure you discuss any questions you have with your health care provider. Document Released: 01/08/2001 Document Revised: 05/23/2016 Document Reviewed: 05/23/2016 Elsevier Interactive Patient Education  2019 [employer].

## 2018-06-10 NOTE — Progress Notes (Signed)
Subjective:    Patient ID: Misty Vega, female    DOB: [birth year], 68 y.o.   MRN: [id number]  HPI  Misty Vega is a 68 year old female who presents today with a chief complaint of nasal congestion.  She also reports headache, sore throat, chills, rhinorrhea, sneezing. Her symptoms began two evenings ago. She denies fevers, body aches, sick contacts. She's tried taking AirBorne and Mucinex, and Bendaryl without much improvement. She's not sleeping well at night due to rhinorrhea.   Review of Systems  Constitutional: Positive for chills. Negative for fatigue and fever.  HENT: Positive for congestion, postnasal drip, rhinorrhea and sore throat. Negative for sinus pressure.   Neurological: Positive for headaches.       No past medical history on file.   Social History   Socioeconomic History  . Marital status: Married    Spouse name: Not on file  . Number of children: Not on file  . Years of education: Not on file  . Highest education level: Not on file  Occupational History  . Not on file  Social Needs  . Financial resource strain: Not on file  . Food insecurity:    Worry: Not on file    Inability: Not on file  . Transportation needs:    Medical: Not on file    Non-medical: Not on file  Tobacco Use  . Smoking status: Never Smoker  . Smokeless tobacco: Never Used  Substance and Sexual Activity  . Alcohol use: No    Alcohol/week: 0.0 standard drinks  . Drug use: No  . Sexual activity: Not on file  Lifestyle  . Physical activity:    Days per week: Not on file    Minutes per session: Not on file  . Stress: Not on file  Relationships  . Social connections:    Talks on phone: Not on file    Gets together: Not on file    Attends religious service: Not on file    Active member of club or organization: Not on file    Attends meetings of clubs or organizations: Not on file    Relationship status: Not on file  . Intimate partner violence:    Fear of current or ex  partner: Not on file    Emotionally abused: Not on file    Physically abused: Not on file    Forced sexual activity: Not on file  Other Topics Concern  . Not on file  Social History Narrative  . Not on file    No past surgical history on file.  Family History  Problem Relation Age of Onset  . Cancer Mother        liver  . Hypertension Father   . Diabetes Neg Hx   . Heart disease Neg Hx   . Stroke Neg Hx     No Known Allergies  Current Outpatient Medications on File Prior to Visit  Medication Sig Dispense Refill  . Calcium-Vitamin D-Vitamin K (VIACTIV PO) Take 1 capsule by mouth daily.     . Multiple Vitamin (MULTIVITAMIN WITH MINERALS) TABS tablet Take 1 tablet by mouth daily.    . naproxen (NAPROSYN) 500 MG tablet Take 1 tablet (500 mg total) by mouth 2 (two) times daily with a meal. 14 tablet 0  . predniSONE (DELTASONE) 20 MG tablet 3 tabs po day one, then 2 tabs daily x 4 days 11 tablet 0  . traMADol (ULTRAM) 50 MG tablet Take 1 tablet (50 mg total)  by mouth every 8 (eight) hours as needed for moderate pain. 15 tablet 0   No current facility-administered medications on file prior to visit.     BP (!) 146/84   Pulse 76   Temp 98.1 F (36.7 C) (Oral)   Ht 5\' 6"  (1.676 m)   Wt 172 lb (78 kg)   SpO2 98%   BMI 27.76 kg/m    Objective:   Physical Exam  Constitutional: She appears well-nourished. She does not appear ill.  HENT:  Right Ear: Tympanic membrane and ear canal normal.  Left Ear: Tympanic membrane and ear canal normal.  Nose: Mucosal edema present. Right sinus exhibits no maxillary sinus tenderness and no frontal sinus tenderness. Left sinus exhibits no maxillary sinus tenderness and no frontal sinus tenderness.  Mouth/Throat: Oropharynx is clear and moist.  Neck: Neck supple.  Cardiovascular: Normal rate and regular rhythm.  Respiratory: Effort normal and breath sounds normal. She has no wheezes.  Skin: Skin is warm and dry.           Assessment  & Plan:  Allergic Rhinitis:  Sneezing, nasal congestion, rhinorrhea x 2 days. Respiratory exam overall unremarkable. Doesn't appear ill. Suspect allergy involvement. Treat with Zyrtec HS and Flonase PRN. Return precautions provided.  Pleas Koch, NP

## 2018-12-09 ENCOUNTER — Ambulatory Visit (INDEPENDENT_AMBULATORY_CARE_PROVIDER_SITE_OTHER): Payer: Medicare Other | Admitting: Internal Medicine

## 2018-12-09 ENCOUNTER — Telehealth: Payer: Self-pay

## 2018-12-09 DIAGNOSIS — Z20828 Contact with and (suspected) exposure to other viral communicable diseases: Secondary | ICD-10-CM | POA: Diagnosis not present

## 2018-12-09 DIAGNOSIS — Z20822 Contact with and (suspected) exposure to covid-19: Secondary | ICD-10-CM

## 2018-12-09 NOTE — Telephone Encounter (Addendum)
Pt saw couple 12/05/18 for brief period but did not do social distancing or wear mask. The wk before pts friends seen on 12/05/18 were exposed to a person who tested positive for covid on 12/08/18 and was admitted to hospital. That couple and person testing positive were not wearing mask. Pt wants to be cautious and has virtual appt today at 2:15 with Avie Echevaria NP to see if covid testing is needed.Pt has no covid symptoms and no travel.

## 2018-12-09 NOTE — Progress Notes (Signed)
Virtual Visit via Video Note  I connected with Misty Vega on 12/09/18 at  2:15 PM EDT by a video enabled telemedicine application and verified that I am speaking with the correct person using two identifiers.  Location: Patient: Home Provider: Office   I discussed the limitations of evaluation and management by telemedicine and the availability of in person appointments. The patient expressed understanding and agreed to proceed.  History of Present Illness:  Pt reports COVID exposure. She reports she had contact with a couple 4 days ago that have now been diagnosed with COVID and admitted to the hospital. This is very concerning for her. She is not having any symptoms.     History reviewed. No pertinent past medical history.  Current Outpatient Medications  Medication Sig Dispense Refill  . Calcium-Vitamin D-Vitamin K (VIACTIV PO) Take 1 capsule by mouth daily.     . Multiple Vitamin (MULTIVITAMIN WITH MINERALS) TABS tablet Take 1 tablet by mouth daily.     No current facility-administered medications for this visit.     No Known Allergies  Family History  Problem Relation Age of Onset  . Cancer Mother        liver  . Hypertension Father   . Diabetes Neg Hx   . Heart disease Neg Hx   . Stroke Neg Hx     Social History   Socioeconomic History  . Marital status: Married    Spouse name: Not on file  . Number of children: Not on file  . Years of education: Not on file  . Highest education level: Not on file  Occupational History  . Not on file  Social Needs  . Financial resource strain: Not on file  . Food insecurity    Worry: Not on file    Inability: Not on file  . Transportation needs    Medical: Not on file    Non-medical: Not on file  Tobacco Use  . Smoking status: Never Smoker  . Smokeless tobacco: Never Used  Substance and Sexual Activity  . Alcohol use: No    Alcohol/week: 0.0 standard drinks  . Drug use: No  . Sexual activity: Not on file   Lifestyle  . Physical activity    Days per week: Not on file    Minutes per session: Not on file  . Stress: Not on file  Relationships  . Social Herbalist on phone: Not on file    Gets together: Not on file    Attends religious service: Not on file    Active member of club or organization: Not on file    Attends meetings of clubs or organizations: Not on file    Relationship status: Not on file  . Intimate partner violence    Fear of current or ex partner: Not on file    Emotionally abused: Not on file    Physically abused: Not on file    Forced sexual activity: Not on file  Other Topics Concern  . Not on file  Social History Narrative  . Not on file     Constitutional: Denies fever, malaise, fatigue, headache or abrupt weight changes.  HEENT: Denies eye pain, eye redness, ear pain, ringing in the ears, wax buildup, runny nose, nasal congestion, bloody nose, or sore throat. Respiratory: Denies difficulty breathing, shortness of breath, cough or sputum production.   Cardiovascular: Denies chest pain, chest tightness, palpitations or swelling in the hands or feet.  Gastrointestinal: Denies abdominal pain,  bloating, constipation, diarrhea or blood in the stool.   No other specific complaints in a complete review of systems (except as listed in HPI above).  Observations/Objective:  Wt Readings from Last 3 Encounters:  06/10/18 172 lb (78 kg)  01/22/18 166 lb (75.3 kg)  06/12/17 173 lb 4 oz (78.6 kg)    General: Appears her stated age, well developed, well nourished in NAD. HEENT: Head: normal shape and size;  Pulmonary/Chest: Normal effort. No respiratory distress.  Neurological: Alert and oriented.     BMET    Component Value Date/Time   NA 140 01/15/2018 0939   K 3.9 01/15/2018 0939   CL 105 01/15/2018 0939   CO2 24 01/15/2018 0939   GLUCOSE 103 (H) 01/15/2018 0939   BUN 10 01/15/2018 0939   CREATININE 0.94 01/15/2018 0939   CALCIUM 8.8 (L)  01/15/2018 0939   GFRNONAA >60 01/15/2018 0939   GFRAA >60 01/15/2018 0939    Lipid Panel     Component Value Date/Time   CHOL 155 07/19/2013 0952   TRIG 78.0 07/19/2013 0952   HDL 55.50 07/19/2013 0952   CHOLHDL 3 07/19/2013 0952   VLDL 15.6 07/19/2013 0952   LDLCALC 84 07/19/2013 0952    CBC    Component Value Date/Time   WBC 4.5 01/15/2018 0939   RBC 4.06 01/15/2018 0939   HGB 10.7 (L) 01/15/2018 0939   HCT 34.5 (L) 01/15/2018 0939   PLT 236 01/15/2018 0939   MCV 85.0 01/15/2018 0939   MCH 26.4 01/15/2018 0939   MCHC 31.0 01/15/2018 0939   RDW 13.7 01/15/2018 0939   LYMPHSABS 0.4 (L) 06/19/2008 1224   MONOABS 0.6 06/19/2008 1224   EOSABS 0.3 06/19/2008 1224   BASOSABS 0.0 06/19/2008 1224    Hgb A1C No results found for: HGBA1C     Assessment and Plan:  Exposure to COVID 10:  Discussed that if she was asymptomatic, no indication for testing at this time Monitor symptoms Encouraged self quarantine x 14 days Discussed the importance of hand washing, masking and social distancing Follow up precautions discussed  Follow Up Instructions:    I discussed the assessment and treatment plan with the patient. The patient was provided an opportunity to ask questions and all were answered. The patient agreed with the plan and demonstrated an understanding of the instructions.   The patient was advised to call back or seek an in-person evaluation if the symptoms worsen or if the condition fails to improve as anticipated.     Webb Silversmith, NP

## 2018-12-10 ENCOUNTER — Encounter: Payer: Self-pay | Admitting: Internal Medicine

## 2018-12-10 NOTE — Patient Instructions (Signed)
COVID-19: How to Protect Yourself and Others Know how it spreads  There is currently no vaccine to prevent coronavirus disease 2019 (COVID-19).  The best way to prevent illness is to avoid being exposed to this virus.  The virus is thought to spread mainly from person-to-person. ? Between people who are in close contact with one another (within about 6 feet). ? Through respiratory droplets produced when an infected person coughs, sneezes or talks. ? These droplets can land in the mouths or noses of people who are nearby or possibly be inhaled into the lungs. ? Some recent studies have suggested that COVID-19 may be spread by people who are not showing symptoms. Everyone should Clean your hands often  Wash your hands often with soap and water for at least 20 seconds especially after you have been in a public place, or after blowing your nose, coughing, or sneezing.  If soap and water are not readily available, use a hand sanitizer that contains at least 60% alcohol. Cover all surfaces of your hands and rub them together until they feel dry.  Avoid touching your eyes, nose, and mouth with unwashed hands. Avoid close contact  Stay home if you are sick.  Avoid close contact with people who are sick.  Put distance between yourself and other people. ? Remember that some people without symptoms may be able to spread virus. ? This is especially important for people who are at higher risk of getting very sick.www.cdc.gov/coronavirus/2019-ncov/need-extra-precautions/people-at-higher-risk.html Cover your mouth and nose with a cloth face cover when around others  You could spread COVID-19 to others even if you do not feel sick.  Everyone should wear a cloth face cover when they have to go out in public, for example to the grocery store or to pick up other necessities. ? Cloth face coverings should not be placed on young children under age 2, anyone who has trouble breathing, or is unconscious,  incapacitated or otherwise unable to remove the mask without assistance.  The cloth face cover is meant to protect other people in case you are infected.  Do NOT use a facemask meant for a healthcare worker.  Continue to keep about 6 feet between yourself and others. The cloth face cover is not a substitute for social distancing. Cover coughs and sneezes  If you are in a private setting and do not have on your cloth face covering, remember to always cover your mouth and nose with a tissue when you cough or sneeze or use the inside of your elbow.  Throw used tissues in the trash.  Immediately wash your hands with soap and water for at least 20 seconds. If soap and water are not readily available, clean your hands with a hand sanitizer that contains at least 60% alcohol. Clean and disinfect  Clean AND disinfect frequently touched surfaces daily. This includes tables, doorknobs, light switches, countertops, handles, desks, phones, keyboards, toilets, faucets, and sinks. www.cdc.gov/coronavirus/2019-ncov/prevent-getting-sick/disinfecting-your-home.html  If surfaces are dirty, clean them: Use detergent or soap and water prior to disinfection.  Then, use a household disinfectant. You can see a list of EPA-registered household disinfectants here. cdc.gov/coronavirus 09/01/2018 This information is not intended to replace advice given to you by your health care provider. Make sure you discuss any questions you have with your health care provider. Document Released: 08/11/2018 Document Revised: 09/09/2018 Document Reviewed: 08/11/2018 Elsevier Patient Education  2020 [employer]  

## 2019-02-14 ENCOUNTER — Other Ambulatory Visit: Payer: Self-pay

## 2019-02-14 ENCOUNTER — Ambulatory Visit (HOSPITAL_COMMUNITY)
Admission: EM | Admit: 2019-02-14 | Discharge: 2019-02-14 | Disposition: A | Discharge status: Home / Self Care | Payer: Medicare Other | Attending: Physician Assistant | Admitting: Physician Assistant

## 2019-02-14 ENCOUNTER — Encounter (HOSPITAL_COMMUNITY): Payer: Self-pay

## 2019-02-14 DIAGNOSIS — R112 Nausea with vomiting, unspecified: Secondary | ICD-10-CM | POA: Diagnosis present

## 2019-02-14 DIAGNOSIS — Z20828 Contact with and (suspected) exposure to other viral communicable diseases: Secondary | ICD-10-CM | POA: Diagnosis not present

## 2019-02-14 DIAGNOSIS — Z20822 Contact with and (suspected) exposure to covid-19: Secondary | ICD-10-CM

## 2019-02-14 DIAGNOSIS — R197 Diarrhea, unspecified: Secondary | ICD-10-CM

## 2019-02-14 MED ORDER — ONDANSETRON HCL 4 MG PO TABS: 4.0000 mg | ORAL_TABLET | Freq: Four times a day (QID) | ORAL | 0 refills | Status: DC

## 2019-02-14 NOTE — ED Provider Notes (Signed)
[place]    CSN: [id number] Arrival date & time: 02/14/19  1010      History   Chief Complaint Chief Complaint  Patient presents with  . Sore Throat  . Headache  . Emesis  . Diarrhea    HPI Misty Vega is a 68 y.o. female.   Patient here concerned with "sick" x 3 days.  Admits HA, sore throat, nausea, vomiting, diarrhea.  She reports fever of 101 at home 2 days ago, since resolved.  Denies cough, wheezing, SOB.  She states she has had 1 episode of vomiting and 1 episode of diarrhea last 24 hours.  She has not taken any advil or tylenol today.  She is able to keep water and crackers down.  No sick contacts, no known exposures though she did go to a family reunion last weekend and did not wear a mask.     History reviewed. No pertinent past medical history.  There are no active problems to display for this patient.   History reviewed. No pertinent surgical history.  OB History   No obstetric history on file.      Home Medications    Prior to Admission medications   Medication Sig Start Date End Date Taking? Authorizing Provider  Calcium-Vitamin D-Vitamin K (VIACTIV PO) Take 1 capsule by mouth daily.     [provider]  Multiple Vitamin (MULTIVITAMIN WITH MINERALS) TABS tablet Take 1 tablet by mouth daily.    [provider]  ondansetron (ZOFRAN) 4 MG tablet Take 1 tablet (4 mg total) by mouth every 6 (six) hours. 02/14/19   Peri Jefferson, PA-C    Family History Family History  Problem Relation Age of Onset  . Cancer Mother        liver  . Hypertension Father   . Diabetes Neg Hx   . Heart disease Neg Hx   . Stroke Neg Hx     Social History Social History   Tobacco Use  . Smoking status: Never Smoker  . Smokeless tobacco: Never Used  Substance Use Topics  . Alcohol use: No    Alcohol/week: 0.0 standard drinks  . Drug use: No     Allergies   Patient has no known allergies.   Review of Systems Review of  Systems  Constitutional: Positive for activity change, appetite change, chills, fatigue and fever. Negative for diaphoresis.  HENT: Positive for congestion and sore throat. Negative for ear pain, rhinorrhea, sinus pressure, sinus pain and sneezing.   Eyes: Negative for pain, discharge, redness and itching.  Respiratory: Negative for cough, chest tightness, shortness of breath and wheezing.   Cardiovascular: Negative for chest pain and palpitations.  Gastrointestinal: Positive for diarrhea, nausea and vomiting. Negative for abdominal pain, anal bleeding, blood in stool and constipation.  Musculoskeletal: Negative for arthralgias and myalgias.  Skin: Negative for color change and rash.  Allergic/Immunologic: Negative for food allergies and immunocompromised state.  Neurological: Positive for headaches. Negative for dizziness and light-headedness.  Hematological: Negative for adenopathy. Does not bruise/bleed easily.  Psychiatric/Behavioral: Negative for confusion and sleep disturbance.  All other systems reviewed and are negative.    Physical Exam Triage Vital Signs ED Triage Vitals  Enc Vitals Group     BP 02/14/19 1032 (!) 156/87     Pulse Rate 02/14/19 1032 88     Resp 02/14/19 1032 18     Temp 02/14/19 1032 98.1 F (36.7 C)     Temp Source 02/14/19 1032 Oral  SpO2 02/14/19 1032 95 %     Weight --      Height --      Head Circumference --      Peak Flow --      Pain Score 02/14/19 1033 5     Pain Loc --      Pain Edu? --      Excl. in [place]? --    No data found.  Updated Vital Signs BP (!) 156/87   Pulse 88   Temp 98.1 F (36.7 C) (Oral)   Resp 18   SpO2 95%   Visual Acuity Right Eye Distance:   Left Eye Distance:   Bilateral Distance:    Right Eye Near:   Left Eye Near:    Bilateral Near:     Physical Exam Vitals signs and nursing note reviewed.  Constitutional:      General: She is not in acute distress.    Appearance: She is well-developed. She is not  ill-appearing or toxic-appearing.  HENT:     Head: Atraumatic. Macrocephalic.     Right Ear: Tympanic membrane and external ear normal. No tenderness. No middle ear effusion. Tympanic membrane is not retracted or bulging.     Left Ear: Tympanic membrane and external ear normal. No tenderness.  No middle ear effusion. Tympanic membrane is not retracted or bulging.     Nose: No mucosal edema, congestion or rhinorrhea.     Right Turbinates: Not enlarged or swollen.     Left Turbinates: Not enlarged or swollen.     Right Sinus: No maxillary sinus tenderness or frontal sinus tenderness.     Left Sinus: No maxillary sinus tenderness or frontal sinus tenderness.     Mouth/Throat:     Mouth: Mucous membranes are moist.     Pharynx: Oropharynx is clear. Uvula midline. No pharyngeal swelling, oropharyngeal exudate, posterior oropharyngeal erythema or uvula swelling.     Tonsils: No tonsillar exudate or tonsillar abscesses. 0 on the right. 0 on the left.  Eyes:     Conjunctiva/sclera: Conjunctivae normal.     Pupils: Pupils are equal, round, and reactive to light.  Neck:     Musculoskeletal: Normal range of motion and neck supple.  Cardiovascular:     Rate and Rhythm: Normal rate and regular rhythm.     Heart sounds: No murmur.  Pulmonary:     Effort: Pulmonary effort is normal. No respiratory distress.     Breath sounds: Normal breath sounds.  Abdominal:     General: Bowel sounds are normal. There is no distension.     Palpations: Abdomen is soft.     Tenderness: There is no abdominal tenderness. There is no guarding or rebound.  Lymphadenopathy:     Cervical: No cervical adenopathy.  Skin:    General: Skin is warm and dry.     Capillary Refill: Capillary refill takes less than 2 seconds.  Neurological:     General: No focal deficit present.     Mental Status: She is alert and oriented to person, place, and time.  Psychiatric:        Mood and Affect: Mood normal.        Behavior:  Behavior normal.      UC Treatments / Results  Labs (all labs ordered are listed, but only abnormal results are displayed) Labs Reviewed  NOVEL CORONAVIRUS, NAA (HOSP ORDER, SEND-OUT TO REF LAB; TAT 18-24 HRS)    EKG   Radiology No results found.  Procedures Procedures (including critical care time)  Medications Ordered in UC Medications - No data to display  Initial Impression / Assessment and Plan / UC Course  I have reviewed the triage vital signs and the nursing notes.  Pertinent labs & imaging results that were available during my care of the patient were reviewed by me and considered in my medical decision making (see chart for details).      Final Clinical Impressions(s) / UC Diagnoses   Final diagnoses:  Contact with or exposure to viral disease  Nausea vomiting and diarrhea     Discharge Instructions     Recommend self isolation until results are known. Drink plenty of clear liquids as discussed. Eat crackers and toast, advance diet as tolerated. Go to ER with any worsening symptoms.    ED Prescriptions    Medication Sig Dispense Auth. Provider   ondansetron (ZOFRAN) 4 MG tablet Take 1 tablet (4 mg total) by mouth every 6 (six) hours. 12 tablet Peri Jefferson, PA-C     PDMP not reviewed this encounter.   Peri Jefferson, PA-C 02/14/19 1125

## 2019-02-14 NOTE — Discharge Instructions (Addendum)
Recommend self isolation until results are known. Drink plenty of clear liquids as discussed. Eat crackers and toast, advance diet as tolerated. Go to ER with any worsening symptoms.

## 2019-02-14 NOTE — ED Notes (Signed)
Wrong Covid test order was put in originally. It was D/c'd. New order was put in Epic. Pt should be charged for 1 test only.

## 2019-02-14 NOTE — ED Triage Notes (Signed)
Pt states that she has felt bad the past couple days with fatigue, nausea and vomiting with diarrhea and also has sore throat and headache

## 2019-02-15 LAB — NOVEL CORONAVIRUS, NAA (HOSP ORDER, SEND-OUT TO REF LAB; TAT 18-24 HRS): SARS-CoV-2, NAA: NOT DETECTED

## 2019-08-03 ENCOUNTER — Other Ambulatory Visit: Payer: Self-pay | Admitting: Obstetrics and Gynecology

## 2019-08-03 DIAGNOSIS — Z1231 Encounter for screening mammogram for malignant neoplasm of breast: Secondary | ICD-10-CM

## 2019-08-19 ENCOUNTER — Other Ambulatory Visit: Payer: Self-pay | Admitting: Nurse Practitioner

## 2019-08-19 DIAGNOSIS — M8580 Other specified disorders of bone density and structure, unspecified site: Secondary | ICD-10-CM

## 2019-09-29 ENCOUNTER — Ambulatory Visit
Admission: RE | Admit: 2019-09-29 | Discharge: 2019-09-29 | Disposition: A | Discharge status: Home / Self Care | Payer: Medicare PPO | Source: Ambulatory Visit | Attending: Obstetrics and Gynecology | Admitting: Obstetrics and Gynecology

## 2019-09-29 ENCOUNTER — Other Ambulatory Visit: Payer: Self-pay

## 2019-09-29 DIAGNOSIS — Z1231 Encounter for screening mammogram for malignant neoplasm of breast: Secondary | ICD-10-CM

## 2019-09-30 ENCOUNTER — Encounter: Payer: Self-pay | Admitting: Podiatry

## 2019-09-30 ENCOUNTER — Ambulatory Visit: Payer: Medicare PPO | Admitting: Podiatry

## 2019-09-30 VITALS — Temp 98.2°F

## 2019-09-30 DIAGNOSIS — L600 Ingrowing nail: Secondary | ICD-10-CM | POA: Diagnosis not present

## 2019-09-30 NOTE — Progress Notes (Signed)
° °  Subjective:    Patient ID: Misty Vega, female    DOB: 02-May-1950, 69 y.o.   MRN: [id number]  HPI    Review of Systems  All other systems reviewed and are negative.      Objective:   Physical Exam        Assessment & Plan:

## 2019-09-30 NOTE — Patient Instructions (Signed)

## 2019-10-01 MED ORDER — NEOMYCIN-POLYMYXIN-HC 3.5-10000-1 OT SOLN: OTIC | 0 refills | Status: DC

## 2019-10-01 NOTE — Addendum Note (Signed)
Addended by: Celene Skeen A on: 10/01/2019 08:11 AM   Modules accepted: Orders

## 2019-10-04 NOTE — Progress Notes (Signed)
Subjective:   Patient ID: Misty Vega, female   DOB: 69 y.o.   MRN: [id number]   HPI Patient presents with a painful ingrown toenail of her left big toe that is been present for at least a month.  States that she is tried to soak it in [employer] salts and peroxide and that seems to not be making a difference and patient does not smoke likes to be active   Review of Systems  All other systems reviewed and are negative.       Objective:  Physical Exam Vitals and nursing note reviewed.  Constitutional:      Appearance: She is well-developed.  Pulmonary:     Effort: Pulmonary effort is normal.  Musculoskeletal:        General: Normal range of motion.  Skin:    General: Skin is warm.  Neurological:     Mental Status: She is alert.     Neurovascular status intact muscle strength was found to be adequate range of motion found to be within normal limits.  Patient is noted to have incurvation of the left hallux medial border that is pain full and making it hard for her to wear shoe gear comfortably.  Patient has good digital perfusion well oriented x3 with no redness or drainage coming from the area at this point     Assessment:  Ingrown toenail deformity left hallux medial border with pain     Plan:  H&P condition reviewed and I this point I have recommended removal of the border.  I explained procedure risk and patient wants procedure understanding risk and signed consent form.  I infiltrated the left hallux 60 mg like Marcaine mixture sterile prep done and using sterile instrumentation remove the medial border exposed matrix and applied phenol 3 applications 30 seconds followed by alcohol lavage sterile dressing.  Gave instructions on soaks and reappoint

## 2019-10-15 ENCOUNTER — Other Ambulatory Visit: Payer: Self-pay

## 2019-10-15 ENCOUNTER — Ambulatory Visit
Admission: RE | Admit: 2019-10-15 | Discharge: 2019-10-15 | Disposition: A | Discharge status: Home / Self Care | Payer: Medicare Other | Source: Ambulatory Visit | Attending: Nurse Practitioner | Admitting: Nurse Practitioner

## 2019-10-15 DIAGNOSIS — M8580 Other specified disorders of bone density and structure, unspecified site: Secondary | ICD-10-CM

## 2019-10-29 ENCOUNTER — Other Ambulatory Visit: Payer: Medicare Other

## 2021-05-02 DIAGNOSIS — Z20822 Contact with and (suspected) exposure to covid-19: Secondary | ICD-10-CM | POA: Diagnosis not present

## 2021-08-06 ENCOUNTER — Encounter: Payer: Medicare PPO | Admitting: Family

## 2021-09-17 ENCOUNTER — Ambulatory Visit (INDEPENDENT_AMBULATORY_CARE_PROVIDER_SITE_OTHER): Payer: Medicare HMO | Admitting: Family

## 2021-09-17 ENCOUNTER — Encounter: Payer: Self-pay | Admitting: Internal Medicine

## 2021-09-17 ENCOUNTER — Encounter: Payer: Self-pay | Admitting: Family

## 2021-09-17 VITALS — BP 152/80 | HR 65 | Temp 99.0°F | Resp 16 | Ht 66.0 in | Wt 161.1 lb

## 2021-09-17 DIAGNOSIS — Z1231 Encounter for screening mammogram for malignant neoplasm of breast: Secondary | ICD-10-CM | POA: Diagnosis not present

## 2021-09-17 DIAGNOSIS — Z1211 Encounter for screening for malignant neoplasm of colon: Secondary | ICD-10-CM

## 2021-09-17 DIAGNOSIS — Z780 Asymptomatic menopausal state: Secondary | ICD-10-CM | POA: Diagnosis not present

## 2021-09-17 DIAGNOSIS — N959 Unspecified menopausal and perimenopausal disorder: Secondary | ICD-10-CM | POA: Insufficient documentation

## 2021-09-17 DIAGNOSIS — Z0000 Encounter for general adult medical examination without abnormal findings: Secondary | ICD-10-CM | POA: Diagnosis not present

## 2021-09-17 DIAGNOSIS — Z13220 Encounter for screening for lipoid disorders: Secondary | ICD-10-CM | POA: Diagnosis not present

## 2021-09-17 DIAGNOSIS — Z23 Encounter for immunization: Secondary | ICD-10-CM | POA: Diagnosis not present

## 2021-09-17 LAB — CBC WITH DIFFERENTIAL/PLATELET
Basophils Absolute: 0.1 10*3/uL (ref 0.0–0.1)
Basophils Relative: 1 % (ref 0.0–3.0)
Eosinophils Absolute: 0.3 10*3/uL (ref 0.0–0.7)
Eosinophils Relative: 4.2 % (ref 0.0–5.0)
HCT: 37 % (ref 36.0–46.0)
Hemoglobin: 12.2 g/dL (ref 12.0–15.0)
Lymphocytes Relative: 26.3 % (ref 12.0–46.0)
Lymphs Abs: 1.6 10*3/uL (ref 0.7–4.0)
MCHC: 32.9 g/dL (ref 30.0–36.0)
MCV: 80.7 fl (ref 78.0–100.0)
Monocytes Absolute: 0.6 10*3/uL (ref 0.1–1.0)
Monocytes Relative: 9.7 % (ref 3.0–12.0)
Neutro Abs: 3.7 10*3/uL (ref 1.4–7.7)
Neutrophils Relative %: 58.8 % (ref 43.0–77.0)
Platelets: 279 10*3/uL (ref 150.0–400.0)
RBC: 4.58 Mil/uL (ref 3.87–5.11)
RDW: 14.7 % (ref 11.5–15.5)
WBC: 6.2 10*3/uL (ref 4.0–10.5)

## 2021-09-17 LAB — LIPID PANEL
Cholesterol: 179 mg/dL (ref 0–200)
HDL: 77.1 mg/dL (ref >39.00)
LDL Cholesterol: 84 mg/dL (ref 0–99)
NonHDL: 101.78
Total CHOL/HDL Ratio: 2
Triglycerides: 90 mg/dL (ref 0.0–149.0)
VLDL: 18 mg/dL (ref 0.0–40.0)

## 2021-09-17 LAB — COMPREHENSIVE METABOLIC PANEL
ALT: 11 U/L (ref 0–35)
AST: 18 U/L (ref 0–37)
Albumin: 4.3 g/dL (ref 3.5–5.2)
Alkaline Phosphatase: 76 U/L (ref 39–117)
BUN: 10 mg/dL (ref 6–23)
CO2: 27 mEq/L (ref 19–32)
Calcium: 9.5 mg/dL (ref 8.4–10.5)
Chloride: 104 mEq/L (ref 96–112)
Creatinine, Ser: 0.93 mg/dL (ref 0.40–1.20)
GFR: 62.41 mL/min (ref >60.00)
Glucose, Bld: 93 mg/dL (ref 70–99)
Potassium: 4.3 mEq/L (ref 3.5–5.1)
Sodium: 139 mEq/L (ref 135–145)
Total Bilirubin: 0.5 mg/dL (ref 0.2–1.2)
Total Protein: 6.8 g/dL (ref 6.0–8.3)

## 2021-09-17 MED ORDER — PNEUMOVAX 23 25 MCG/0.5ML IJ INJ: 0.5000 mL | INJECTION | Freq: Once | INTRAMUSCULAR | 0 refills | Status: DC

## 2021-09-17 NOTE — Progress Notes (Signed)
Eugenia Pancoast, FNP  Subjective:   Kammy Klett is a 71 y.o. female who presents for an Initial Medicare Annual Wellness Visit as well as a transition of care visit.   Prior provider was: Webb Silversmith, FNP   Pt is with acute concerns.  Works at [employer] and gets cramps throughout the day.  Not drinking a whole lot of water at work, and on her feet very often throughout the day.   Colonoscopy: last in 2008, with polyp was due for five year repeat, did not.  Pap: 06/10/2017, negative, sees gyn  Mammogram 09/29/19, was dense Shingrix: pt does not remember if she got shingles vaccination.  Covid vaccines: has had x2, does not want any further at this time.  Pneumonia vaccine: had prevnar 13, due for pneu 23 Bone density: unsure when last done  chronic concerns:  Hypocalcemia: 2019, pt is taking daily vitamin d and calcium.   IDA: has been for some time. No blood in stool. No fatigue.    Review of Systems  Constitutional:  Negative for chills and fatigue.  Respiratory:  Negative for cough and shortness of breath.   Cardiovascular:  Negative for chest pain and leg swelling.  Gastrointestinal:  Negative for diarrhea and nausea.  Genitourinary:  Negative for difficulty urinating, menstrual problem and vaginal bleeding.  Neurological:  Negative for dizziness and headaches.  Psychiatric/Behavioral:  Negative for agitation and sleep disturbance.   All other systems reviewed and are negative.    Objective:    Physical Exam Vitals reviewed.  Constitutional:      General: She is not in acute distress.    Appearance: Normal appearance. She is not ill-appearing or toxic-appearing.  HENT:     Right Ear: Tympanic membrane normal.     Left Ear: Tympanic membrane normal.     Mouth/Throat:     Mouth: Mucous membranes are moist.     Pharynx: No pharyngeal swelling.     Tonsils: No tonsillar exudate.  Eyes:     Extraocular Movements: Extraocular movements intact.      Conjunctiva/sclera: Conjunctivae normal.     Pupils: Pupils are equal, round, and reactive to light.  Neck:     Thyroid: No thyroid mass.  Cardiovascular:     Rate and Rhythm: Normal rate and regular rhythm.  Pulmonary:     Effort: Pulmonary effort is normal.     Breath sounds: Normal breath sounds.  Abdominal:     General: Abdomen is flat. Bowel sounds are normal.     Palpations: Abdomen is soft.  Musculoskeletal:        General: Normal range of motion.  Lymphadenopathy:     Cervical:     Right cervical: No superficial cervical adenopathy.    Left cervical: No superficial cervical adenopathy.  Skin:    General: Skin is warm.     Capillary Refill: Capillary refill takes less than 2 seconds.  Neurological:     General: No focal deficit present.     Mental Status: She is alert and oriented to person, place, and time.  Psychiatric:        Mood and Affect: Mood normal.        Behavior: Behavior normal.        Thought Content: Thought content normal.        Judgment: Judgment normal.  Lab Results  Component Value Date   WBC 4.5 01/15/2018   HGB 10.7 (L) 01/15/2018   HCT 34.5 (L) 01/15/2018  MCV 85.0 01/15/2018   PLT 236 01/15/2018    Today's Vitals   09/17/21 0953  BP: (!) 152/80  Pulse: 65  Resp: 16  Temp: 99 F (37.2 C)  SpO2: 97%  Weight: 161 lb 2 oz (73.1 kg)  Height: '5\' 6"'$  (1.676 m)   Body mass index is 26.01 kg/m.      No data to display          Current Medications (verified) Outpatient Encounter Medications as of 09/17/2021  Medication Sig   Calcium-Vitamin D-Vitamin K (VIACTIV PO) Take 1 capsule by mouth daily.    Multiple Vitamin (MULTIVITAMIN WITH MINERALS) TABS tablet Take 1 tablet by mouth daily.   [DISCONTINUED] pneumococcal 23 valent vaccine (PNEUMOVAX 23) 25 MCG/0.5ML injection Inject 0.5 mLs into the muscle once for 1 dose.   [DISCONTINUED] neomycin-polymyxin-hydrocortisone (CORTISPORIN) OTIC solution Apply 1-2 drops to toe after soaking  twice a day (Patient not taking: Reported on 09/17/2021)   [DISCONTINUED] ondansetron (ZOFRAN) 4 MG tablet Take 1 tablet (4 mg total) by mouth every 6 (six) hours. (Patient not taking: Reported on 09/17/2021)   No facility-administered encounter medications on file as of 09/17/2021.    Allergies (verified) Patient has no known allergies.   History: History reviewed. No pertinent past medical history. Past Surgical History:  Procedure Laterality Date   APPENDECTOMY     CATARACT EXTRACTION     Family History  Problem Relation Age of Onset   Liver cancer Mother        non drinker   Hypertension Father    Colon polyps Sister    Diabetes Neg Hx    Heart disease Neg Hx    Stroke Neg Hx    Colon cancer Neg Hx    Esophageal cancer Neg Hx    Stomach cancer Neg Hx    Rectal cancer Neg Hx    Social History   Socioeconomic History   Marital status: Married    Spouse name: Not on file   Number of children: 1   Years of education: Not on file   Highest education level: Not on file  Occupational History   Not on file  Tobacco Use   Smoking status: Never   Smokeless tobacco: Never  Vaping Use   Vaping Use: Never used  Substance and Sexual Activity   Alcohol use: No    Alcohol/week: 0.0 standard drinks of alcohol   Drug use: No   Sexual activity: Yes    Partners: Male    Birth control/protection: Post-menopausal  Other Topics Concern   Not on file  Social History Narrative   One son, deceased   One step daugther    Social Determinants of Health   Financial Resource Strain: Not on file  Food Insecurity: Not on file  Transportation Needs: Not on file  Physical Activity: Not on file  Stress: Not on file  Social Connections: Not on file    Tobacco Counseling Counseling given: Not Answered     Diabetic?no         Activities of Daily Living     No data to display          Patient Care Team: Eugenia Pancoast, FNP as PCP - General (Family  Medicine)  Indicate any recent Medical Services you may have received from other than [facility] providers in the past year (date may be approximate).     Assessment:   This is a routine wellness examination for Nneka.  Hearing/Vision screen Hearing Screening  $'1000Hz'B$  '2000Hz'$  '4000Hz'$  '5000Hz'$   Right ear Passed Passed Passed Passed  Left ear Passed Passed Passed Passed   Vision Screening   Right eye Left eye Both eyes  Without correction 20/25 20/100 20/30  With correction       Dietary issues and exercise activities discussed:     Goals Addressed   None   Depression Screen    01/22/2018    9:57 AM 05/26/2017   10:50 AM 07/19/2013    9:44 AM  PHQ 2/9 Scores  PHQ - 2 Score 0 0 0    Fall Risk    09/17/2021    9:53 AM 01/22/2018    9:57 AM 05/26/2017   10:50 AM 07/19/2013    9:44 AM  Fall Risk   Falls in the past year? 0 No Yes No  Number falls in past yr: 0  1   Comment   09/2016   Injury with Fall? 0  Yes   Comment   right leg fx   Risk for fall due to : No Fall Risks       FALL RISK PREVENTION PERTAINING TO THE HOME:  Any stairs in or around the home? No  If so, are there any without handrails? No  Home free of loose throw rugs in walkways, pet beds, electrical cords, etc? Yes  Adequate lighting in your home to reduce risk of falls? Yes   ASSISTIVE DEVICES UTILIZED TO PREVENT FALLS:  Life alert? No  Use of a cane, walker or w/c? No  Grab bars in the bathroom? No  Shower chair or bench in shower? No  Elevated toilet seat or a handicapped toilet? No   TIMED UP AND GO:  Was the test performed? Yes .  Length of time to ambulate 10 feet: cont sec.   Gait steady and fast without use of assistive device  Cognitive Function:        Immunizations Immunization History  Administered Date(s) Administered   Influenza,inj,Quad PF,6+ Mos 03/05/2018   Janssen (J&J) SARS-COV-2 Vaccination 07/31/2019, 03/06/2020   Pneumococcal Conjugate-13 05/26/2017   Pneumococcal  Polysaccharide-23 09/17/2021   Zoster, Live 03/13/2015    TDAP status: Up to date  Flu Vaccine status: Up to date  Pneumococcal vaccine status: Completed during today's visit.  Covid-19 vaccine status: Information provided on how to obtain vaccines.   Qualifies for Shingles Vaccine? Yes   Zostavax completed  unknown can't remember   Shingrix Completed?: No.    Education has been provided regarding the importance of this vaccine. Patient has been advised to call insurance company to determine out of pocket expense if they have not yet received this vaccine. Advised may also receive vaccine at local pharmacy or Health Dept. Verbalized acceptance and understanding.  Screening Tests Health Maintenance  Topic Date Due   Zoster Vaccines- Shingrix (1 of 2) Never done   COLONOSCOPY (Pts 45-60yr Insurance coverage will need to be confirmed)  04/29/2016   COVID-19 Vaccine (3 - Booster for Janssen series) 05/01/2020   MAMMOGRAM  09/28/2021   INFLUENZA VACCINE  11/27/2021   TETANUS/TDAP  11/20/2028   Pneumonia Vaccine 70 Years old  Completed   DEXA SCAN  Completed   Hepatitis C Screening  Completed   HPV VACCINES  Aged Out    Health Maintenance  Health Maintenance Due  Topic Date Due   Zoster Vaccines- Shingrix (1 of 2) Never done   COLONOSCOPY (Pts 45-474yrInsurance coverage will need to be confirmed)  04/29/2016   COVID-19  Vaccine (3 - Booster for Janssen series) 05/01/2020   MAMMOGRAM  09/28/2021    Colorectal cancer screening: Referral to GI placed today. Pt aware the office will call re: appt.  Mammogram status: Ordered screening mammogram. Pt provided with contact info and advised to call to schedule appt.   Bone Density status: Ordered today. Pt provided with contact info and advised to call to schedule appt.  Lung Cancer Screening: (Low Dose CT Chest recommended if Age 63-80 years, 30 pack-year currently smoking OR have quit w/in 15years.) does not qualify.   Lung  Cancer Screening Referral: n/a  Additional Screening:  Hepatitis C Screening: does not qualify; pt declines.  Vision Screening: Recommended annual ophthalmology exams for early detection of glaucoma and other disorders of the eye. Is the patient up to date with their annual eye exam?  No  Who is the provider or what is the name of the office in which the patient attends annual eye exams? Jodene Nam, Overdue. Will make appt. If pt is not established with a provider, would they like to be referred to a provider to establish care? No .   Dental Screening: Recommended annual dental exams for proper oral hygiene  Community Resource Referral / Chronic Care Management: CRR required this visit?  No   CCM required this visit?  No      Assessment & Plan:   Problem List Items Addressed This Visit       Other   Screening for lipoid disorders    Lipid panel ordered pending results      Relevant Orders   Lipid panel (Completed)   Encounter for screening mammogram for malignant neoplasm of breast    Mammogram ordered. Pending results.       Relevant Orders   MM 3D SCREEN BREAST BILATERAL   Encounter for screening colonoscopy - Primary    A referral was placed today for Gi to set up colonoscopy.         Relevant Orders   Ambulatory referral to Gastroenterology   Need for 23-polyvalent pneumococcal polysaccharide vaccine    ppsv23 vaccine administered in office Pt tolerated procedure well  Verbal consent obtained prior to administration  Handout given in regards to vaccination.        Relevant Orders   Pneumococcal polysaccharide vaccine 23-valent greater than or equal to 2yo subcutaneous/IM (Completed)   Encounter for Medicare annual wellness exam    Patient Counseling(The following topics were reviewed):  Preventative care handout given to pt  Health maintenance and immunizations reviewed. Please refer to Health maintenance section. Pt advised on safe sex, wearing  seatbelts in car, and proper nutrition labwork ordered today for annual Dental health: Discussed importance of regular tooth brushing, flossing, and dental visits.        Encounter for vaccination     vaccine administered in office Pt tolerated procedure well  Verbal consent obtained prior to administration  Handout given in regards to vaccination.        Post-menopausal    Bone density ordered pending results.        RESOLVED: Post menopausal problems   Relevant Orders   DG Bone Density   RESOLVED: Welcome to Medicare preventive visit    Patient Counseling(The following topics were reviewed):  Preventative care handout given to pt  Health maintenance and immunizations reviewed. Please refer to Health maintenance section. Pt advised on safe sex, wearing seatbelts in car, and proper nutrition labwork ordered today for annual Dental health: Discussed importance  of regular tooth brushing, flossing, and dental visits.        Relevant Orders   Comprehensive metabolic panel (Completed)   Lipid panel (Completed)   CBC with Differential/Platelet (Completed)    Meds ordered this encounter  Medications   DISCONTD: pneumococcal 23 valent vaccine (PNEUMOVAX 23) 25 MCG/0.5ML injection    Sig: Inject 0.5 mLs into the muscle once for 1 dose.    Dispense:  0.5 mL    Refill:  0    Order Specific Question:   Supervising Provider    Answer:   BEDSOLE, AMY E [[id number]]    Follow-up: Return in about 1 month (around 10/18/2021) for with blood pressure log .   I have personally reviewed and noted the following in the patient's chart:   Medical and social history Use of alcohol, tobacco or illicit drugs  Current medications and supplements including opioid prescriptions. Patient is not currently taking opioid prescriptions. Functional ability and status Nutritional status Physical activity Advanced directives List of other physicians Hospitalizations, surgeries, and ER visits in  previous 12 months Vitals Screenings to include cognitive, depression, and falls Referrals and appointments  In addition, I have reviewed and discussed with patient certain preventive protocols, quality metrics, and best practice recommendations. A written personalized care plan for preventive services as well as general preventive health recommendations were provided to patient.   Eugenia Pancoast, FNP   10/09/2021

## 2021-09-17 NOTE — Patient Instructions (Addendum)
I recommend you take magnesium malleate 300-400 mg once daily for your leg cramps.  Also wear compression stockings during the day with long times of standing.  Elevate legs at night time.   Start monitoring your blood pressure daily, around the same time of day, for the next 2-3 weeks.  Ensure that you have rested for 30 minutes prior to checking your blood pressure. Record your readings and bring them to your next visit.  I have ordered a bone density scan to assess for osteoporosis. Please call [facility] to schedule your appt along with your mammogram.  You have an appointment scheduled for:  '[x]'$   3D Mammogram  '[x]'$   Bone Density   '[x]'$   [facility]      [place], [place]         Make sure to wear two piece  clothing  No lotions powders or deodorants the day of the appointment Make sure to bring picture ID and insurance card.  Bring list of medications you are currently taking including any supplements.    A referral was placed today for Gi to set up your colonoscopy.  Please let us know if you have not heard back within 1-2 weeks about your referral.  Welcome to our clinic, I am happy to have you as my new patient. I am excited to continue on this healthcare journey with you.  Stop by the lab prior to leaving today. I will notify you of your results once received.   Please keep in mind Any my chart messages you send have p to a three business day turnaround for a response.  Phone calls may have up to a one day business turnaround for a  response.   If you need a medication refill I recommend you request it through the pharmacy as this is easiest for [place] rather than sending a message and or phone call.   Due to recent changes in healthcare laws, you may see results of your imaging and/or laboratory studies on MyChart before I have had a chance to review them.  I understand that in some cases there may be  results that are confusing or concerning to you. Please understand that not all results are received at the same time and often I may need to interpret multiple results in order to provide you with the best plan of care or course of treatment. Therefore, I ask that you please give me 2 business days to thoroughly review all your results before contacting my office for clarification. Should we see a critical lab result, you will be contacted sooner.   It was a pleasure seeing you today! Please do not hesitate to reach out with any questions and or concerns.  Regards,   Colleen Kotlarz FNP-C      Bone Density Test A bone density test uses a type of X-ray to measure the amount of calcium and other minerals in a person's bones. It can measure bone density in the hip and the spine. The test is similar to having a regular X-ray. This test may also be called: Bone densitometry. Bone mineral density test. Dual-energy X-ray absorptiometry (DEXA). You may have this test to: Diagnose a condition that causes weak or thin bones (osteoporosis). Screen you for osteoporosis. Predict your risk for a broken bone (fracture). Determine how well your osteoporosis treatment is working. Tell a health  care provider about: Any allergies you have. All medicines you are taking, including vitamins, herbs, eye drops, creams, and over-the-counter medicines. Any problems you or family members have had with anesthetic medicines. Any blood disorders you have. Any surgeries you have had. Any medical conditions you have. Whether you are pregnant or may be pregnant. Any medical tests you have had within the past 14 days that used contrast material. What are the risks? Generally, this is a safe test. However, it does expose you to a small amount of radiation, which can slightly increase your cancer risk. What happens before the test? Do not take any calcium supplements within the 24 hours before your test. You will  need to remove all metal jewelry, eyeglasses, removable dental appliances, and any other metal objects on your body. What happens during the test?  You will lie down on an exam table. There will be an X-ray generator below you and an imaging device above you. Other devices, such as boxes or braces, may be used to position your body properly for the scan. The machine will slowly scan your body. You will need to keep very still while the machine does the scan. The images will show up on a screen in the room. Images will be examined by a specialist after your test is finished. The procedure may vary among health care providers and hospitals. What can I expect after the test? It is up to you to get the results of your test. Ask your health care provider, or the department that is doing the test, when your results will be ready. Summary A bone density test is an imaging test that uses a type of X-ray to measure the amount of calcium and other minerals in your bones. The test may be used to diagnose or screen you for a condition that causes weak or thin bones (osteoporosis), predict your risk for a broken bone (fracture), or determine how well your osteoporosis treatment is working. Do not take any calcium supplements within 24 hours before your test. Ask your health care provider, or the department that is doing the test, when your results will be ready. This information is not intended to replace advice given to you by your health care provider. Make sure you discuss any questions you have with your health care provider. Document Revised: 12/27/2020 Document Reviewed: 09/30/2019 Elsevier Patient Education  [place].  Colonoscopy, Adult A colonoscopy is a procedure to look at the entire large intestine. This procedure is done using a long, thin, flexible tube that has a camera on the end. You may have a colonoscopy: As a part of normal colorectal screening. If you have certain symptoms,  such as: A low number of red blood cells in your blood (anemia). Diarrhea that does not go away. Pain in your abdomen. Blood in your stool. A colonoscopy can help screen for and diagnose medical problems, including: An abnormal growth of cells or tissue (tumor). Abnormal growths within the lining of your intestine (polyps). Inflammation. Areas of bleeding. Tell your health care provider about: Any allergies you have. All medicines you are taking, including vitamins, herbs, eye drops, creams, and over-the-counter medicines. Any problems you or family members have had with anesthetic medicines. Any bleeding problems you have. Any surgeries you have had. Any medical conditions you have. Any problems you have had with having bowel movements. Whether you are pregnant or may be pregnant. What are the risks? Generally, this is a safe procedure. However, problems may  occur, including: Bleeding. Damage to your intestine. Allergic reactions to medicines given during the procedure. Infection. This is rare. What happens before the procedure? Eating and drinking restrictions Follow instructions from your health care provider about eating or drinking restrictions, which may include: A few days before the procedure: Follow a low-fiber diet. Avoid nuts, seeds, dried fruit, raw fruits, and vegetables. 1-3 days before the procedure: Eat only gelatin dessert or ice pops. Drink only clear liquids, such as water, clear juice, clear broth or bouillon, black coffee or tea, or clear soft drinks or sports drinks. Avoid liquids that contain red or purple dye. The day of the procedure: Do not eat solid foods. You may continue to drink clear liquids until up to 2 hours before the procedure. Do not eat or drink anything starting 2 hours before the procedure, or within the time period that your health care provider recommends. Bowel prep If you were prescribed a bowel prep to take by mouth (orally) to clean  out your colon: Take it as told by your health care provider. Starting the day before your procedure, you will need to drink a large amount of liquid medicine. The liquid will cause you to have many bowel movements of loose stool until your stool becomes almost clear or light green. If your skin or the opening between the buttocks (anus) gets irritated from diarrhea, you may relieve the irritation using: Wipes with medicine in them, such as adult wet wipes with aloe and vitamin E. A product to soothe skin, such as petroleum jelly. If you vomit while drinking the bowel prep: Take a break for up to 60 minutes. Begin the bowel prep again. Call your health care provider if you keep vomiting or you cannot take the bowel prep without vomiting. To clean out your colon, you may also be given: Laxative medicines. These help you have a bowel movement. Instructions for enema use. An enema is liquid medicine injected into your rectum. Medicines Ask your health care provider about: Changing or stopping your regular medicines or supplements. This is especially important if you are taking iron supplements, diabetes medicines, or blood thinners. Taking medicines such as aspirin and ibuprofen. These medicines can thin your blood. Do not take these medicines unless your health care provider tells you to take them. Taking over-the-counter medicines, vitamins, herbs, and supplements. General instructions Ask your health care provider what steps will be taken to help prevent infection. These may include washing skin with a germ-killing soap. If you will be going home right after the procedure, plan to have a responsible adult: Take you home from the hospital or clinic. You will not be allowed to drive. Care for you for the time you are told. What happens during the procedure?  An IV will be inserted into one of your veins. You will be given a medicine to make you fall asleep (general anesthetic). You will lie  on your side with your knees bent. A lubricant will be put on the tube. Then the tube will be: Inserted into your anus. Gently eased through all parts of your large intestine. Air will be sent into your colon to keep it open. This may cause some pressure or cramping. Images will be taken with the camera and will appear on a screen. A small tissue sample may be removed to be looked at under a microscope (biopsy). The tissue may be sent to a lab for testing if any signs of problems are found. If small polyps are  found, they may be removed and checked for cancer cells. When the procedure is finished, the tube will be removed. The procedure may vary among health care providers and hospitals. What happens after the procedure? Your blood pressure, heart rate, breathing rate, and blood oxygen level will be monitored until you leave the hospital or clinic. You may have a small amount of blood in your stool. You may pass gas and have mild cramping or bloating in your abdomen. This is caused by the air that was used to open your colon during the exam. If you were given a sedative during the procedure, it can affect you for several hours. Do not drive or operate machinery until your health care provider says that it is safe. It is up to you to get the results of your procedure. Ask your health care provider, or the department that is doing the procedure, when your results will be ready. Summary A colonoscopy is a procedure to look at the entire large intestine. Follow instructions from your health care provider about eating and drinking before the procedure. If you were prescribed an oral bowel prep to clean out your colon, take it as told by your health care provider. During the colonoscopy, a flexible tube with a camera on its end is inserted into the anus and then passed into all parts of the large intestine. This information is not intended to replace advice given to you by your health care provider.  Make sure you discuss any questions you have with your health care provider. Document Revised: 04/09/2021 Document Reviewed: 12/06/2020 Elsevier Patient Education  [place] Prevention in the Home, Adult Falls can cause injuries and can happen to people of all ages. There are many things you can do to make your home safe and to help prevent falls. Ask for help when making these changes. What actions can I take to prevent falls? General Instructions Use good lighting in all rooms. Replace any light bulbs that burn out. Turn on the lights in dark areas. Use night-lights. Keep items that you use often in easy-to-reach places. Lower the shelves around your home if needed. Set up your furniture so you have a clear path. Avoid moving your furniture around. Do not have throw rugs or other things on the floor that can make you trip. Avoid walking on wet floors. If any of your floors are uneven, fix them. Add color or contrast paint or tape to clearly mark and help you see: Grab bars or handrails. First and last steps of staircases. Where the edge of each step is. If you use a stepladder: Make sure that it is fully opened. Do not climb a closed stepladder. Make sure the sides of the stepladder are locked in place. Ask someone to hold the stepladder while you use it. Know where your pets are when moving through your home. What can I do in the bathroom?     Keep the floor dry. Clean up any water on the floor right away. Remove soap buildup in the tub or shower. Use nonskid mats or decals on the floor of the tub or shower. Attach bath mats securely with double-sided, nonslip rug tape. If you need to sit down in the shower, use a plastic, nonslip stool. Install grab bars by the toilet and in the tub and shower. Do not use towel bars as grab bars. What can I do in the bedroom? Make sure that you have a light by your bed  that is easy to reach. Do not use any sheets or blankets  for your bed that hang to the floor. Have a firm chair with side arms that you can use for support when you get dressed. What can I do in the kitchen? Clean up any spills right away. If you need to reach something above you, use a step stool with a grab bar. Keep electrical cords out of the way. Do not use floor polish or wax that makes floors slippery. What can I do with my stairs? Do not leave any items on the stairs. Make sure that you have a light switch at the top and the bottom of the stairs. Make sure that there are handrails on both sides of the stairs. Fix handrails that are broken or loose. Install nonslip stair treads on all your stairs. Avoid having throw rugs at the top or bottom of the stairs. Choose a carpet that does not hide the edge of the steps on the stairs. Check carpeting to make sure that it is firmly attached to the stairs. Fix carpet that is loose or worn. What can I do on the outside of my home? Use bright outdoor lighting. Fix the edges of walkways and driveways and fix any cracks. Remove anything that might make you trip as you walk through a door, such as a raised step or threshold. Trim any bushes or trees on paths to your home. Check to see if handrails are loose or broken and that both sides of all steps have handrails. Install guardrails along the edges of any raised decks and porches. Clear paths of anything that can make you trip, such as tools or rocks. Have leaves, snow, or ice cleared regularly. Use sand or salt on paths during winter. Clean up any spills in your garage right away. This includes grease or oil spills. What other actions can I take? Wear shoes that: Have a low heel. Do not wear high heels. Have rubber bottoms. Feel good on your feet and fit well. Are closed at the toe. Do not wear open-toe sandals. Use tools that help you move around if needed. These include: Canes. Walkers. Scooters. Crutches. Review your medicines with your  doctor. Some medicines can make you feel dizzy. This can increase your chance of falling. Ask your doctor what else you can do to help prevent falls. Where to find more information Centers for Disease Control and Prevention, [place]: http://www.wolf.info/ National Institute on Aging: http://kim-miller.com/ Contact a doctor if: You are afraid of falling at home. You feel weak, drowsy, or dizzy at home. You fall at home. Summary There are many simple things that you can do to make your home safe and to help prevent falls. Ways to make your home safe include removing things that can make you trip and installing grab bars in the bathroom. Ask for help when making these changes in your home. This information is not intended to replace advice given to you by your health care provider. Make sure you discuss any questions you have with your health care provider. Document Revised: 01/15/2021 Document Reviewed: 11/17/2019 Elsevier Patient Education  [place] Maintenance, Female Adopting a healthy lifestyle and getting preventive care are important in promoting health and wellness. Ask your health care provider about: The right schedule for you to have regular tests and exams. Things you can do on your own to prevent diseases and keep yourself healthy. What should I know about diet, weight, and exercise? Eat  a healthy diet  Eat a diet that includes plenty of vegetables, fruits, low-fat dairy products, and lean protein. Do not eat a lot of foods that are high in solid fats, added sugars, or sodium. Maintain a healthy weight Body mass index (BMI) is used to identify weight problems. It estimates body fat based on height and weight. Your health care provider can help determine your BMI and help you achieve or maintain a healthy weight. Get regular exercise Get regular exercise. This is one of the most important things you can do for your health. Most adults should: Exercise for at least 150 minutes  each week. The exercise should increase your heart rate and make you sweat (moderate-intensity exercise). Do strengthening exercises at least twice a week. This is in addition to the moderate-intensity exercise. Spend less time sitting. Even light physical activity can be beneficial. Watch cholesterol and blood lipids Have your blood tested for lipids and cholesterol at 70 years of age, then have this test every 5 years. Have your cholesterol levels checked more often if: Your lipid or cholesterol levels are high. You are older than 70 years of age. You are at high risk for heart disease. What should I know about cancer screening? Depending on your health history and family history, you may need to have cancer screening at various ages. This may include screening for: Breast cancer. Cervical cancer. Colorectal cancer. Skin cancer. Lung cancer. What should I know about heart disease, diabetes, and high blood pressure? Blood pressure and heart disease High blood pressure causes heart disease and increases the risk of stroke. This is more likely to develop in people who have high blood pressure readings or are overweight. Have your blood pressure checked: Every 3-5 years if you are 19-61 years of age. Every year if you are 22 years old or older. Diabetes Have regular diabetes screenings. This checks your fasting blood sugar level. Have the screening done: Once every three years after age 36 if you are at a normal weight and have a low risk for diabetes. More often and at a younger age if you are overweight or have a high risk for diabetes. What should I know about preventing infection? Hepatitis B If you have a higher risk for hepatitis B, you should be screened for this virus. Talk with your health care provider to find out if you are at risk for hepatitis B infection. Hepatitis C Testing is recommended for: Everyone born from [birth year] through 1964. Anyone with known risk factors for  hepatitis C. Sexually transmitted infections (STIs) Get screened for STIs, including gonorrhea and chlamydia, if: You are sexually active and are younger than 70 years of age. You are older than 70 years of age and your health care provider tells you that you are at risk for this type of infection. Your sexual activity has changed since you were last screened, and you are at increased risk for chlamydia or gonorrhea. Ask your health care provider if you are at risk. Ask your health care provider about whether you are at high risk for HIV. Your health care provider may recommend a prescription medicine to help prevent HIV infection. If you choose to take medicine to prevent HIV, you should first get tested for HIV. You should then be tested every 3 months for as long as you are taking the medicine. Pregnancy If you are about to stop having your period (premenopausal) and you may become pregnant, seek counseling before you get pregnant. Take 400  to 800 micrograms (mcg) of folic acid every day if you become pregnant. Ask for birth control (contraception) if you want to prevent pregnancy. Osteoporosis and menopause Osteoporosis is a disease in which the bones lose minerals and strength with aging. This can result in bone fractures. If you are 12 years old or older, or if you are at risk for osteoporosis and fractures, ask your health care provider if you should: Be screened for bone loss. Take a calcium or vitamin D supplement to lower your risk of fractures. Be given hormone replacement therapy (HRT) to treat symptoms of menopause. Follow these instructions at home: Alcohol use Do not drink alcohol if: Your health care provider tells you not to drink. You are pregnant, may be pregnant, or are planning to become pregnant. If you drink alcohol: Limit how much you have to: 0-1 drink a day. Know how much alcohol is in your drink. In the [place] one drink equals one 12 oz bottle of beer (355 mL), one 5  oz glass of wine (148 mL), or one 1 oz glass of hard liquor (44 mL). Lifestyle Do not use any products that contain nicotine or tobacco. These products include cigarettes, chewing tobacco, and vaping devices, such as e-cigarettes. If you need help quitting, ask your health care provider. Do not use street drugs. Do not share needles. Ask your health care provider for help if you need support or information about quitting drugs. General instructions Schedule regular health, dental, and eye exams. Stay current with your vaccines. Tell your health care provider if: You often feel depressed. You have ever been abused or do not feel safe at home. Summary Adopting a healthy lifestyle and getting preventive care are important in promoting health and wellness. Follow your health care provider's instructions about healthy diet, exercising, and getting tested or screened for diseases. Follow your health care provider's instructions on monitoring your cholesterol and blood pressure. This information is not intended to replace advice given to you by your health care provider. Make sure you discuss any questions you have with your health care provider. Document Revised: 09/04/2020 Document Reviewed: 09/04/2020 Elsevier Patient Education  [place] A mammogram is an X-ray of the breasts. This procedure can screen for and detect any changes that may indicate breast cancer. Mammograms are regularly done beginning at age 62 for women with average risk. A man may have a mammogram if he has a lump or swelling in his breast tissue. A mammogram can also identify other changes and variations in the breast, such as: Inflammation of the breast tissue (mastitis). An infected area that contains a collection of pus (abscess). A fluid-filled sac (cyst). Tumors that are not cancerous (benign). Fibrocystic changes. This is when breast tissue becomes denser and can make the tissue feel rope-like or  uneven under the skin. Women at higher risk for breast cancer need earlier and more comprehensive screening for abnormal changes. Breast tomosynthesis, or three-dimensional (3D) mammography, and digital breast tomosynthesis are advanced forms of imaging that create 3D pictures of the breasts. Tell a health care provider: About any allergies you have. If you have breast implants. If you have had previous breast disease, biopsy, or surgery. If you have a family history of breast cancer. If you are breastfeeding. Whether you are pregnant or may be pregnant. What are the risks? Generally, this is a safe procedure. However, problems may occur, including: Exposure to radiation. Radiation levels are very low with this test. The need  for more tests. The mammogram fails to detect certain cancers or the results are misinterpreted. Difficulty with detecting breast cancer in women with dense breasts. What happens before the procedure? Schedule your test about 1-2 weeks after your menstrual period if you are menstruating. This is usually when your breasts are the least tender. If you have had a mammogram done at a different facility in the past, get the mammogram X-rays or have them sent to your current exam facility. The new and old images will be compared. Wash your breasts and underarms on the day of the test. Do not wear deodorants, perfumes, lotions, or powders anywhere on your body on the day of the test. Remove any jewelry from your neck. Wear clothes that you can change into and out of easily. What happens during the procedure?  You will undress from the waist up and put on a gown that opens in the front. You will stand in front of the X-ray machine. Each breast will be placed between two plastic or glass plates. The plates will compress your breast for a few seconds. Try to stay as relaxed as possible. This procedure does not cause any harm to your breasts. Any discomfort you feel will be very  brief. X-rays will be taken from different angles of each breast. The procedure may vary among health care providers and hospitals. What can I expect after the procedure? The mammogram will be examined by a specialist (radiologist). You may need to repeat certain parts of the test, depending on the quality of the images. This is done if the radiologist needs a better view of the breast tissue. You may resume your normal activities. It is up to you to get the results of your procedure. Ask your health care provider, or the department that is doing the procedure, when your results will be ready. Summary A mammogram is an X-ray of the breasts. This procedure can screen for and detect any changes that may indicate breast cancer. A man may have a mammogram if he has a lump or swelling in his breast tissue. If you have had a mammogram done at a different facility in the past, get the mammogram X-rays or have them sent to your current exam facility in order to compare them. Schedule your test about 1-2 weeks after your menstrual period if you are menstruating. Ask when your test results will be ready. Make sure you get your test results. This information is not intended to replace advice given to you by your health care provider. Make sure you discuss any questions you have with your health care provider. Document Revised: 12/27/2020 Document Reviewed: 02/14/2020 Elsevier Patient Education  [place].

## 2021-09-21 ENCOUNTER — Telehealth: Payer: Self-pay | Admitting: Family

## 2021-09-21 NOTE — Telephone Encounter (Signed)
Pt wanted to update Tabitha about her blood pressure, pt stated it was high on Monday but fine the rest of the week. She also wants to know about a bone density test that Tabitha can put in, please return a call back when possible.  Callback number: [phone]

## 2021-09-25 DIAGNOSIS — Z23 Encounter for immunization: Secondary | ICD-10-CM | POA: Insufficient documentation

## 2021-09-25 DIAGNOSIS — Z0000 Encounter for general adult medical examination without abnormal findings: Secondary | ICD-10-CM | POA: Insufficient documentation

## 2021-09-26 NOTE — Telephone Encounter (Signed)
She said that her number are normal. She said the last visit the front desk make her mad on top of being caught in a traffic jam. She Stated she called Friday and let them know that her readings are normal, and the paper she had the Bp written down on she threw it away.

## 2021-10-04 ENCOUNTER — Ambulatory Visit (AMBULATORY_SURGERY_CENTER): Payer: Medicare HMO | Admitting: *Deleted

## 2021-10-04 VITALS — Ht 66.0 in | Wt 158.0 lb

## 2021-10-04 DIAGNOSIS — Z86010 Personal history of colonic polyps: Secondary | ICD-10-CM

## 2021-10-04 MED ORDER — SUTAB 1479-225-188 MG PO TABS: 1.0000 | ORAL_TABLET | ORAL | 0 refills | Status: DC

## 2021-10-04 NOTE — Progress Notes (Signed)
No egg or soy allergy known to patient  No issues known to pt with past sedation with any surgeries or procedures Patient denies ever being told they had issues or difficulty with intubation  No FH of Malignant Hyperthermia Pt is not on diet pills Pt is not on  home 02  Pt is not on blood thinners  Pt denies issues with constipation  No A fib or A flutter  Sutab Coupon to pt in PV today , Code to Pharmacy and  NO PA's for preps discussed with pt In PV today  Discussed with pt there will be an out-of-pocket cost for prep and that varies from $0 to 70 +  dollars - pt verbalized understanding  Pt instructed to use Singlecare.com or GoodRx for a price reduction on prep   PV completed in person. Pt verified name, DOB, address and insurance during PV today.  Pt given instruction packet with copy of consent form to read and signed after procedure and instructions explained and questions answered. Pt encouraged to call with questions or issues.   Insurance confirmed with pt at [facility] today

## 2021-10-08 ENCOUNTER — Telehealth: Payer: Self-pay

## 2021-10-08 NOTE — Telephone Encounter (Signed)
Patient called in stating that her blood pressure reading was 143/79.

## 2021-10-08 NOTE — Telephone Encounter (Signed)
Spoke to pt and she will send you the reading though mychart.

## 2021-10-08 NOTE — Telephone Encounter (Signed)
Spoke to pt and she is going to send Tabitha a Pharmacist, community message with her blood pressure reading.

## 2021-10-09 DIAGNOSIS — Z780 Asymptomatic menopausal state: Secondary | ICD-10-CM | POA: Insufficient documentation

## 2021-10-09 NOTE — Assessment & Plan Note (Signed)
Patient Counseling(The following topics were reviewed): ? Preventative care handout given to pt  ?Health maintenance and immunizations reviewed. Please refer to Health maintenance section. ?Pt advised on safe sex, wearing seatbelts in car, and proper nutrition ?labwork ordered today for annual ?Dental health: Discussed importance of regular tooth brushing, flossing, and dental visits. ? ? ?

## 2021-10-09 NOTE — Assessment & Plan Note (Signed)
Bone density ordered pending results.

## 2021-10-09 NOTE — Assessment & Plan Note (Signed)
vaccine administered in office Pt tolerated procedure well  Verbal consent obtained prior to administration  Handout given in regards to vaccination.

## 2021-10-09 NOTE — Assessment & Plan Note (Signed)
Mammogram ordered. Pending results. 

## 2021-10-09 NOTE — Assessment & Plan Note (Addendum)
A referral was placed today for Gi to set up colonoscopy.

## 2021-10-09 NOTE — Assessment & Plan Note (Signed)
Lipid panel ordered pending results.   

## 2021-10-09 NOTE — Assessment & Plan Note (Signed)
ppsv23 vaccine administered in office Pt tolerated procedure well  Verbal consent obtained prior to administration  Handout given in regards to vaccination.

## 2021-10-24 ENCOUNTER — Encounter: Payer: Self-pay | Admitting: Internal Medicine

## 2021-11-01 ENCOUNTER — Encounter: Payer: Medicare HMO | Admitting: Internal Medicine

## 2021-12-12 ENCOUNTER — Ambulatory Visit (AMBULATORY_SURGERY_CENTER): Payer: Medicare HMO | Admitting: Internal Medicine

## 2021-12-12 ENCOUNTER — Encounter: Payer: Self-pay | Admitting: Internal Medicine

## 2021-12-12 VITALS — BP 131/83 | HR 77 | Temp 97.8°F | Resp 17 | Ht 66.0 in | Wt 158.0 lb

## 2021-12-12 DIAGNOSIS — D128 Benign neoplasm of rectum: Secondary | ICD-10-CM

## 2021-12-12 DIAGNOSIS — Z86010 Personal history of colonic polyps: Secondary | ICD-10-CM | POA: Diagnosis not present

## 2021-12-12 DIAGNOSIS — Z09 Encounter for follow-up examination after completed treatment for conditions other than malignant neoplasm: Secondary | ICD-10-CM

## 2021-12-12 MED ORDER — SODIUM CHLORIDE 0.9 % IV SOLN: 500.0000 mL | Freq: Once | INTRAVENOUS | Status: DC

## 2021-12-12 NOTE — Progress Notes (Signed)
Report to PACU, RN, vss, BBS= Clear.  

## 2021-12-12 NOTE — Op Note (Signed)
[facility] Patient Name: Misty Vega Procedure Date: 12/12/2021 9:58 AM MRN: [id number] Endoscopist: Docia Chuck. 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 Previous exam 2008 Medicines:                Monitored Anesthesia Care Procedure:                Pre-Anesthesia Assessment:                           - Prior to the procedure, a History and Physical                            was performed, and patient medications and                            allergies were reviewed. The patient's tolerance of                            previous anesthesia was also reviewed. The risks                            and benefits of the procedure and the sedation                            options and risks were discussed with the patient.                            All questions were answered, and informed consent                            was obtained. Prior Anticoagulants: The patient has                            taken no previous anticoagulant or antiplatelet                            agents. ASA Grade Assessment: II - A patient with                            mild systemic disease. After reviewing the risks                            and benefits, the patient was deemed in  satisfactory condition to undergo the procedure.                           After obtaining informed consent, the colonoscope                            was passed under direct vision. Throughout the                            procedure, the patient's blood pressure, pulse, and                            oxygen saturations were monitored continuously. The                             Olympus CF-HQ190L [id number]) Colonoscope was                            introduced through the anus and advanced to the the                            cecum, identified by appendiceal orifice and                            ileocecal valve. The ileocecal valve, appendiceal                            orifice, and rectum were photographed. The quality                            of the bowel preparation was adequate (AFTER                            EXTENSIVE IRRIGATION AND SUCTION) to identify                            polyps. The colonoscopy was performed without                            difficulty. The patient tolerated the procedure                            well. The bowel preparation used was Prepopik via                            split dose instruction. Scope In: 10:08:19 AM Scope Out: 10:31:02 AM Scope Withdrawal Time: 0 hours 17 minutes 58 seconds  Total Procedure Duration: 0 hours 22 minutes 43 seconds  Findings:                 A 12 mm polyp was found in the distal rectum. The                            polyp was sessile. The polyp was removed with a  cold snare. Resection and retrieval were complete.                           The exam was otherwise without abnormality on                            direct and retroflexion views. Complications:            No immediate complications. Estimated blood loss:                            None. Estimated Blood Loss:     Estimated blood loss: none. Impression:               - One 12 mm polyp in the distal rectum, removed                            with a cold snare. Resected and retrieved.                           - The examination was otherwise normal on direct                            and retroflexion views. Recommendation:           - Repeat colonoscopy in 3 years for surveillance                            (MORE EXTENSIVE PREP).                           - Patient has a contact  number available for                            emergencies. The signs and symptoms of potential                            delayed complications were discussed with the                            patient. Return to normal activities tomorrow.                            Written discharge instructions were provided to the                            patient.                           - Resume previous diet.                           - Continue present medications.                           - Await pathology results. Docia Chuck. Henrene Pastor, MD 12/12/2021 10:37:45 AM This report has been signed electronically.

## 2021-12-12 NOTE — Progress Notes (Signed)
VS completed by DT.  Pt's states no medical or surgical changes since previsit or office visit.  

## 2021-12-12 NOTE — Progress Notes (Signed)
Called to room to assist during endoscopic procedure.  Patient ID and intended procedure confirmed with present staff. Received instructions for my participation in the procedure from the performing physician.  

## 2021-12-12 NOTE — Patient Instructions (Addendum)
Information on polyps given to you today.  Await pathology results.  Resume previous diet and medications   YOU HAD AN ENDOSCOPIC PROCEDURE TODAY AT [facility]:   Refer to the procedure report that was given to you for any specific questions about what was found during the examination.  If the procedure report does not answer your questions, please call your gastroenterologist to clarify.  If you requested that your care partner not be given the details of your procedure findings, then the procedure report has been included in a sealed envelope for you to review at your convenience later.  YOU SHOULD EXPECT: Some feelings of bloating in the abdomen. Passage of more gas than usual.  Walking can help get rid of the air that was put into your GI tract during the procedure and reduce the bloating. If you had a lower endoscopy (such as a colonoscopy or flexible sigmoidoscopy) you may notice spotting of blood in your stool or on the toilet paper. If you underwent a bowel prep for your procedure, you may not have a normal bowel movement for a few days.  Please Note:  You might notice some irritation and congestion in your nose or some drainage.  This is from the oxygen used during your procedure.  There is no need for concern and it should clear up in a day or so.  SYMPTOMS TO REPORT IMMEDIATELY:  Following lower endoscopy (colonoscopy or flexible sigmoidoscopy):  Excessive amounts of blood in the stool  Significant tenderness or worsening of abdominal pains  Swelling of the abdomen that is new, acute  Fever of 100F or higher  For urgent or emergent issues, a gastroenterologist can be reached at any hour by calling [phone]. Do not use MyChart messaging for urgent concerns.    DIET:  We do recommend a small meal at first, but then you may proceed to your regular diet.  Drink plenty of fluids but you should avoid alcoholic beverages for 24 hours.  ACTIVITY:  You should  plan to take it easy for the rest of today and you should NOT DRIVE or use heavy machinery until tomorrow (because of the sedation medicines used during the test).    FOLLOW UP: Our staff will call the number listed on your records the next business day following your procedure.  We will call around 7:15- 8:00 am to check on you and address any questions or concerns that you may have regarding the information given to you following your procedure. If we do not reach you, we will leave a message.  If you develop any symptoms (ie: fever, flu-like symptoms, shortness of breath, cough etc.) before then, please call [phone].  If you test positive for Covid 19 in the 2 weeks post procedure, please call and report this information to [place].    If any biopsies were taken you will be contacted by phone or by letter within the next 1-3 weeks.  Please call us at [phone] if you have not heard about the biopsies in 3 weeks.    SIGNATURES/CONFIDENTIALITY: You and/or your care partner have signed paperwork which will be entered into your electronic medical record.  These signatures attest to the fact that that the information above on your After Visit Summary has been reviewed and is understood.  Full responsibility of the confidentiality of this discharge information lies with you and/or your care-partner.

## 2021-12-12 NOTE — Progress Notes (Signed)
HISTORY OF PRESENT ILLNESS:  Misty Vega is a 71 y.o. female with a history of sessile serrated polyp on colonoscopy 2008.  Presents now for surveillance colonoscopy  REVIEW OF SYSTEMS:  All non-GI ROS negative. History reviewed. No pertinent past medical history.  Past Surgical History:  Procedure Laterality Date   APPENDECTOMY     CATARACT EXTRACTION     COLONOSCOPY     POLYPECTOMY      Social History Misty Vega  reports that she has never smoked. She has never used smokeless tobacco. She reports that she does not drink alcohol and does not use drugs.  family history includes Colon polyps in her sister; Hypertension in her father; Liver cancer in her mother.  No Known Allergies     PHYSICAL EXAMINATION: Vital signs: BP (!) 146/97   Pulse 74   Temp 97.8 F (36.6 C) (Temporal)   Resp 14   Ht '5\' 6"'$  (1.676 m)   Wt 158 lb (71.7 kg)   SpO2 97%   BMI 25.50 kg/m  General: Well-developed, well-nourished, no acute distress HEENT: Sclerae are anicteric, conjunctiva pink. Oral mucosa intact Lungs: Clear Heart: Regular Abdomen: soft, nontender, nondistended, no obvious ascites, no peritoneal signs, normal bowel sounds. No organomegaly. Extremities: No edema Psychiatric: alert and oriented x3. Cooperative      ASSESSMENT:  History of sessile serrated polyp   PLAN:  Surveillance colonoscopy

## 2021-12-13 ENCOUNTER — Telehealth: Payer: Self-pay

## 2021-12-13 NOTE — Telephone Encounter (Signed)
Attempted f/u call. No answer, left VM. 

## 2021-12-19 ENCOUNTER — Encounter: Payer: Self-pay | Admitting: Internal Medicine

## 2022-02-12 ENCOUNTER — Ambulatory Visit (INDEPENDENT_AMBULATORY_CARE_PROVIDER_SITE_OTHER): Payer: Medicare HMO | Admitting: *Deleted

## 2022-02-12 DIAGNOSIS — Z0000 Encounter for general adult medical examination without abnormal findings: Secondary | ICD-10-CM | POA: Diagnosis not present

## 2022-02-12 NOTE — Progress Notes (Signed)
Subjective:   Misty Vega is a 71 y.o. female who presents for Medicare Annual (Subsequent) preventive examination.  I connected with  Misty Vega on 02/12/22 by a telephone enabled telemedicine application and verified that I am speaking with the correct person using two identifiers.   I discussed the limitations of evaluation and management by telemedicine. The patient expressed understanding and agreed to proceed.  Patient location: home  Provider location: Tele-Health-home    Review of Systems     Cardiac Risk Factors include: advanced age (>56mn, >>79women)     Objective:    Today's Vitals   There is no height or weight on file to calculate BMI.     02/12/2022    9:35 AM  Advanced Directives  Does Patient Have a Medical Advance Directive? No  Would patient like information on creating a medical advance directive? No - Patient declined    Current Medications (verified) Outpatient Encounter Medications as of 02/12/2022  Medication Sig   Calcium-Vitamin D-Vitamin K (VIACTIV PO) Take 1 capsule by mouth daily.    Multiple Vitamin (MULTIVITAMIN WITH MINERALS) TABS tablet Take 1 tablet by mouth daily.   No facility-administered encounter medications on file as of 02/12/2022.    Allergies (verified) Patient has no known allergies.   History: History reviewed. No pertinent past medical history. Past Surgical History:  Procedure Laterality Date   APPENDECTOMY     CATARACT EXTRACTION     COLONOSCOPY     POLYPECTOMY     Family History  Problem Relation Age of Onset   Liver cancer Mother        non drinker   Hypertension Father    Colon polyps Sister    Diabetes Neg Hx    Heart disease Neg Hx    Stroke Neg Hx    Colon cancer Neg Hx    Esophageal cancer Neg Hx    Stomach cancer Neg Hx    Rectal cancer Neg Hx    Social History   Socioeconomic History   Marital status: Married    Spouse name: Not on file   Number of children: 1    Years of education: Not on file   Highest education level: Not on file  Occupational History   Not on file  Tobacco Use   Smoking status: Never   Smokeless tobacco: Never  Vaping Use   Vaping Use: Never used  Substance and Sexual Activity   Alcohol use: No    Alcohol/week: 0.0 standard drinks of alcohol   Drug use: No   Sexual activity: Yes    Partners: Male    Birth control/protection: Post-menopausal  Other Topics Concern   Not on file  Social History Narrative   One son, deceased   One step daugther    Social Determinants of Health   Financial Resource Strain: Low Risk  (02/12/2022)   Overall Financial Resource Strain (CARDIA)    Difficulty of Paying Living Expenses: Not hard at all  Food Insecurity: No Food Insecurity (02/12/2022)   Hunger Vital Sign    Worried About Running Out of Food in the Last Year: Never true    Ran Out of Food in the Last Year: Never true  Transportation Needs: No Transportation Needs (02/12/2022)   PRAPARE - THydrologist(Medical): No    Lack of Transportation (Non-Medical): No  Physical Activity: Sufficiently Active (02/12/2022)   Exercise Vital Sign    Days of Exercise per Week:  4 days    Minutes of Exercise per Session: 60 min  Stress: No Stress Concern Present (02/12/2022)   [place]    Feeling of Stress : Not at all  Social Connections: Unknown (02/12/2022)   Social Connection and Isolation Panel [NHANES]    Frequency of Communication with Friends and Family: More than three times a week    Frequency of Social Gatherings with Friends and Family: More than three times a week    Attends Religious Services: More than 4 times per year    Active Member of [employer] or Organizations: No    Attends Music therapist: Never    Marital Status: Not on file    Tobacco Counseling Counseling given: Not Answered   Clinical  Intake:  Pre-visit preparation completed: Yes  Pain : No/denies pain     Diabetes: No  How often do you need to have someone help you when you read instructions, pamphlets, or other written materials from your doctor or pharmacy?: 1 - Never  Diabetic?  no  Interpreter Needed?: No  Information entered by :: Leroy Kennedy LPN   Activities of Daily Living    02/12/2022    9:43 AM  In your present state of health, do you have any difficulty performing the following activities:  Hearing? 0  Vision? 0  Difficulty concentrating or making decisions? 0  Dressing or bathing? 0  Doing errands, shopping? 0  Preparing Food and eating ? N  Using the Toilet? N  In the past six months, have you accidently leaked urine? N  Do you have problems with loss of bowel control? N  Managing your Medications? N  Managing your Finances? N  Housekeeping or managing your Housekeeping? N    Patient Care Team: Eugenia Pancoast, FNP as PCP - General (Family Medicine)  Indicate any recent Medical Services you may have received from other than [facility] providers in the past year (date may be approximate).     Assessment:   This is a routine wellness examination for Misty Vega.  Hearing/Vision screen Hearing Screening - Comments:: No trouble hearing Vision Screening - Comments:: [facility] Up to date  Dietary issues and exercise activities discussed: Current Exercise Habits: The patient has a physically strenuous job, but has no regular exercise apart from work.   Goals Addressed             This Visit's Progress    Patient Stated       No goals       Depression Screen    02/12/2022    9:39 AM 01/22/2018    9:57 AM 05/26/2017   10:50 AM 07/19/2013    9:44 AM  PHQ 2/9 Scores  PHQ - 2 Score 0 0 0 0  PHQ- 9 Score 0       Fall Risk    02/12/2022    9:35 AM 09/17/2021    9:53 AM 01/22/2018    9:57 AM 05/26/2017   10:50 AM 07/19/2013    9:44 AM  Fall Risk   Falls in the past year? 0 0 No Yes  No  Number falls in past yr: 0 0  1   Comment    09/2016   Injury with Fall? 0 0  Yes   Comment    right leg fx   Risk for fall due to :  No Fall Risks     Follow up Falls evaluation completed;Education provided;Falls prevention discussed  FALL RISK PREVENTION PERTAINING TO THE HOME:  Any stairs in or around the home? Yes  If so, are there any without handrails? No  Home free of loose throw rugs in walkways, pet beds, electrical cords, etc? Yes  Adequate lighting in your home to reduce risk of falls? Yes   ASSISTIVE DEVICES UTILIZED TO PREVENT FALLS:  Life alert? No  Use of a cane, walker or w/c? No  Grab bars in the bathroom? No  Shower chair or bench in shower? No  Elevated toilet seat or a handicapped toilet? No   TIMED UP AND GO:  Was the test performed? No .    Cognitive Function:        02/12/2022    9:36 AM  6CIT Screen  What Year? 0 points  What month? 0 points  What time? 0 points  Count back from 20 0 points  Months in reverse 0 points  Repeat phrase 0 points  Total Score 0 points    Immunizations Immunization History  Administered Date(s) Administered   Influenza,inj,Quad PF,6+ Mos 03/05/2018   Janssen (J&J) SARS-COV-2 Vaccination 07/31/2019, 03/06/2020   Pneumococcal Conjugate-13 05/26/2017   Pneumococcal Polysaccharide-23 09/17/2021   Zoster, Live 03/13/2015    TDAP status: Up to date  Flu Vaccine status: Due, Education has been provided regarding the importance of this vaccine. Advised may receive this vaccine at local pharmacy or Health Dept. Aware to provide a copy of the vaccination record if obtained from local pharmacy or Health Dept. Verbalized acceptance and understanding.  Pneumococcal vaccine status: Up to date  Covid-19 vaccine status: Declined, Education has been provided regarding the importance of this vaccine but patient still declined. Advised may receive this vaccine at local pharmacy or Health Dept.or vaccine clinic.  Aware to provide a copy of the vaccination record if obtained from local pharmacy or Health Dept. Verbalized acceptance and understanding.  Qualifies for Shingles Vaccine? Yes   Zostavax completed Yes   Shingrix Completed?: No.    Education has been provided regarding the importance of this vaccine. Patient has been advised to call insurance company to determine out of pocket expense if they have not yet received this vaccine. Advised may also receive vaccine at local pharmacy or Health Dept. Verbalized acceptance and understanding.  Screening Tests Health Maintenance  Topic Date Due   MAMMOGRAM  09/28/2021   INFLUENZA VACCINE  11/27/2021   COVID-19 Vaccine (3 - Booster for Janssen series) 02/28/2022 (Originally 05/01/2020)   Zoster Vaccines- Shingrix (1 of 2) 05/15/2022 (Originally 07/03/2001)   COLONOSCOPY (Pts 45-33yr Insurance coverage will need to be confirmed)  12/12/2024   TETANUS/TDAP  11/20/2028   Pneumonia Vaccine 70 Years old  Completed   DEXA SCAN  Completed   Hepatitis C Screening  Completed   HPV VACCINES  Aged Out    Health Maintenance  Health Maintenance Due  Topic Date Due   MAMMOGRAM  09/28/2021   INFLUENZA VACCINE  11/27/2021    Colorectal cancer screening: Type of screening: Colonoscopy. Completed 2023. Repeat every 3 years  Mammo scheduled  Bone Density scheduled  Lung Cancer Screening: (Low Dose CT Chest recommended if Age 38107-80years, 30 pack-year currently smoking OR have quit w/in 15years.) does not qualify.   Lung Cancer Screening Referral:   Additional Screening:  Hepatitis C Screening: does not qualify; Completed 2019  Vision Screening: Recommended annual ophthalmology exams for early detection of glaucoma and other disorders of the eye. Is the patient up to date with their annual  eye exam?  Yes  Who is the provider or what is the name of the office in which the patient attends annual eye exams? [facility] If pt is not established with a provider,  would they like to be referred to a provider to establish care? No .   Dental Screening: Recommended annual dental exams for proper oral hygiene  Community Resource Referral / Chronic Care Management: CRR required this visit?  No   CCM required this visit?  No      Plan:     I have personally reviewed and noted the following in the patient's chart:   Medical and social history Use of alcohol, tobacco or illicit drugs  Current medications and supplements including opioid prescriptions. Patient is not currently taking opioid prescriptions. Functional ability and status Nutritional status Physical activity Advanced directives List of other physicians Hospitalizations, surgeries, and ER visits in previous 12 months Vitals Screenings to include cognitive, depression, and falls Referrals and appointments  In addition, I have reviewed and discussed with patient certain preventive protocols, quality metrics, and best practice recommendations. A written personalized care plan for preventive services as well as general preventive health recommendations were provided to patient.     Leroy Kennedy, LPN   75/30/0511   Nurse Notes:

## 2022-02-12 NOTE — Patient Instructions (Signed)
Misty Vega , Thank you for taking time to come for your Medicare Wellness Visit. I appreciate your ongoing commitment to your health goals. Please review the following plan we discussed and let me know if I can assist you in the future.   These are the goals we discussed:  Goals      Patient Stated     No goals        This is a list of the screening recommended for you and due dates:  Health Maintenance  Topic Date Due   Mammogram  09/28/2021   Flu Shot  11/27/2021   COVID-19 Vaccine (3 - Booster for [employer] series) 02/28/2022*   Zoster (Shingles) Vaccine (1 of 2) 05/15/2022*   Colon Cancer Screening  12/12/2024   Tetanus Vaccine  11/20/2028   Pneumonia Vaccine  Completed   DEXA scan (bone density measurement)  Completed   Hepatitis C Screening: USPSTF Recommendation to screen - Ages 59-79 yo.  Completed   HPV Vaccine  Aged Out  *Topic was postponed. The date shown is not the original due date.    Advanced directives: Education provided  Conditions/risks identified:     Preventive Care 70 Years and Older, Female Preventive care refers to lifestyle choices and visits with your health care provider that can promote health and wellness. What does preventive care include? A yearly physical exam. This is also called an annual well check. Dental exams once or twice a year. Routine eye exams. Ask your health care provider how often you should have your eyes checked. Personal lifestyle choices, including: Daily care of your teeth and gums. Regular physical activity. Eating a healthy diet. Avoiding tobacco and drug use. Limiting alcohol use. Practicing safe sex. Taking low-dose aspirin every day. Taking vitamin and mineral supplements as recommended by your health care provider. What happens during an annual well check? The services and screenings done by your health care provider during your annual well check will depend on your age, overall health, lifestyle risk factors,  and family history of disease. Counseling  Your health care provider may ask you questions about your: Alcohol use. Tobacco use. Drug use. Emotional well-being. Home and relationship well-being. Sexual activity. Eating habits. History of falls. Memory and ability to understand (cognition). Work and work Statistician. Reproductive health. Screening  You may have the following tests or measurements: Height, weight, and BMI. Blood pressure. Lipid and cholesterol levels. These may be checked every 5 years, or more frequently if you are over 10 years old. Skin check. Lung cancer screening. You may have this screening every year starting at age 70 if you have a 30-pack-year history of smoking and currently smoke or have quit within the past 15 years. Fecal occult blood test (FOBT) of the stool. You may have this test every year starting at age 70. Flexible sigmoidoscopy or colonoscopy. You may have a sigmoidoscopy every 5 years or a colonoscopy every 10 years starting at age 79. Hepatitis C blood test. Hepatitis B blood test. Sexually transmitted disease (STD) testing. Diabetes screening. This is done by checking your blood sugar (glucose) after you have not eaten for a while (fasting). You may have this done every 1-3 years. Bone density scan. This is done to screen for osteoporosis. You may have this done starting at age 70. Mammogram. This may be done every 1-2 years. Talk to your health care provider about how often you should have regular mammograms. Talk with your health care provider about your test results, treatment  options, and if necessary, the need for more tests. Vaccines  Your health care provider may recommend certain vaccines, such as: Influenza vaccine. This is recommended every year. Tetanus, diphtheria, and acellular pertussis (Tdap, Td) vaccine. You may need a Td booster every 10 years. Zoster vaccine. You may need this after age 70. Pneumococcal 13-valent conjugate  (PCV13) vaccine. One dose is recommended after age 70. Pneumococcal polysaccharide (PPSV23) vaccine. One dose is recommended after age 70. Talk to your health care provider about which screenings and vaccines you need and how often you need them. This information is not intended to replace advice given to you by your health care provider. Make sure you discuss any questions you have with your health care provider. Document Released: 05/12/2015 Document Revised: 01/03/2016 Document Reviewed: 02/14/2015 Elsevier Interactive Patient Education  2017 [place] Prevention in the Home Falls can cause injuries. They can happen to people of all ages. There are many things you can do to make your home safe and to help prevent falls. What can I do on the outside of my home? Regularly fix the edges of walkways and driveways and fix any cracks. Remove anything that might make you trip as you walk through a door, such as a raised step or threshold. Trim any bushes or trees on the path to your home. Use bright outdoor lighting. Clear any walking paths of anything that might make someone trip, such as rocks or tools. Regularly check to see if handrails are loose or broken. Make sure that both sides of any steps have handrails. Any raised decks and porches should have guardrails on the edges. Have any leaves, snow, or ice cleared regularly. Use sand or salt on walking paths during winter. Clean up any spills in your garage right away. This includes oil or grease spills. What can I do in the bathroom? Use night lights. Install grab bars by the toilet and in the tub and shower. Do not use towel bars as grab bars. Use non-skid mats or decals in the tub or shower. If you need to sit down in the shower, use a plastic, non-slip stool. Keep the floor dry. Clean up any water that spills on the floor as soon as it happens. Remove soap buildup in the tub or shower regularly. Attach bath mats securely with  double-sided non-slip rug tape. Do not have throw rugs and other things on the floor that can make you trip. What can I do in the bedroom? Use night lights. Make sure that you have a light by your bed that is easy to reach. Do not use any sheets or blankets that are too big for your bed. They should not hang down onto the floor. Have a firm chair that has side arms. You can use this for support while you get dressed. Do not have throw rugs and other things on the floor that can make you trip. What can I do in the kitchen? Clean up any spills right away. Avoid walking on wet floors. Keep items that you use a lot in easy-to-reach places. If you need to reach something above you, use a strong step stool that has a grab bar. Keep electrical cords out of the way. Do not use floor polish or wax that makes floors slippery. If you must use wax, use non-skid floor wax. Do not have throw rugs and other things on the floor that can make you trip. What can I do with my stairs? Do not  leave any items on the stairs. Make sure that there are handrails on both sides of the stairs and use them. Fix handrails that are broken or loose. Make sure that handrails are as long as the stairways. Check any carpeting to make sure that it is firmly attached to the stairs. Fix any carpet that is loose or worn. Avoid having throw rugs at the top or bottom of the stairs. If you do have throw rugs, attach them to the floor with carpet tape. Make sure that you have a light switch at the top of the stairs and the bottom of the stairs. If you do not have them, ask someone to add them for you. What else can I do to help prevent falls? Wear shoes that: Do not have high heels. Have rubber bottoms. Are comfortable and fit you well. Are closed at the toe. Do not wear sandals. If you use a stepladder: Make sure that it is fully opened. Do not climb a closed stepladder. Make sure that both sides of the stepladder are locked  into place. Ask someone to hold it for you, if possible. Clearly mark and make sure that you can see: Any grab bars or handrails. First and last steps. Where the edge of each step is. Use tools that help you move around (mobility aids) if they are needed. These include: Canes. Walkers. Scooters. Crutches. Turn on the lights when you go into a dark area. Replace any light bulbs as soon as they burn out. Set up your furniture so you have a clear path. Avoid moving your furniture around. If any of your floors are uneven, fix them. If there are any pets around you, be aware of where they are. Review your medicines with your doctor. Some medicines can make you feel dizzy. This can increase your chance of falling. Ask your doctor what other things that you can do to help prevent falls. This information is not intended to replace advice given to you by your health care provider. Make sure you discuss any questions you have with your health care provider. Document Released: 02/09/2009 Document Revised: 09/21/2015 Document Reviewed: 05/20/2014 Elsevier Interactive Patient Education  2017 [employer].

## 2022-03-13 ENCOUNTER — Ambulatory Visit
Admission: RE | Admit: 2022-03-13 | Discharge: 2022-03-13 | Disposition: A | Discharge status: Home / Self Care | Payer: Medicare HMO | Source: Ambulatory Visit | Attending: Family | Admitting: Family

## 2022-03-13 DIAGNOSIS — N959 Unspecified menopausal and perimenopausal disorder: Secondary | ICD-10-CM

## 2022-03-13 DIAGNOSIS — M8589 Other specified disorders of bone density and structure, multiple sites: Secondary | ICD-10-CM | POA: Diagnosis not present

## 2022-03-13 DIAGNOSIS — Z780 Asymptomatic menopausal state: Secondary | ICD-10-CM | POA: Diagnosis not present

## 2022-03-13 DIAGNOSIS — Z1231 Encounter for screening mammogram for malignant neoplasm of breast: Secondary | ICD-10-CM

## 2022-06-09 NOTE — Progress Notes (Unsigned)
    Tayana Shankle T. Kelsa Jaworowski, MD, [facility] [place], [address]  Phone: [phone]  FAX: [phone]  Arelene Moroni - 72 y.o. female  MRN [id number]  Date of Birth: [birth year]  Date: 06/10/2022  PCP: Eugenia Pancoast, FNP  Referral: Eugenia Pancoast, FNP  No chief complaint on file.  Subjective:   Kerianne Gurr is a 72 y.o. very pleasant female patient with There is no height or weight on file to calculate BMI. who presents with the following:  She is a pleasant 72 year old female, she presents with right-sided shoulder and arm pain.    Review of Systems is noted in the HPI, as appropriate  Objective:   There were no vitals taken for this visit.  GEN: No acute distress; alert,appropriate. PULM: Breathing comfortably in no respiratory distress PSYCH: Normally interactive.   Laboratory and Imaging Data:  Assessment and Plan:   ***

## 2022-06-10 ENCOUNTER — Ambulatory Visit (INDEPENDENT_AMBULATORY_CARE_PROVIDER_SITE_OTHER): Payer: Medicare HMO | Admitting: Family Medicine

## 2022-06-10 ENCOUNTER — Encounter: Payer: Self-pay | Admitting: Family Medicine

## 2022-06-10 VITALS — BP 130/80 | HR 81 | Temp 97.7°F | Ht 66.0 in | Wt 162.0 lb

## 2022-06-10 DIAGNOSIS — M7501 Adhesive capsulitis of right shoulder: Secondary | ICD-10-CM | POA: Diagnosis not present

## 2022-06-10 DIAGNOSIS — M25511 Pain in right shoulder: Secondary | ICD-10-CM

## 2022-06-10 MED ORDER — DICLOFENAC SODIUM 75 MG PO TBEC: 75.0000 mg | DELAYED_RELEASE_TABLET | Freq: Two times a day (BID) | ORAL | 3 refills | Status: DC

## 2022-07-18 DIAGNOSIS — D225 Melanocytic nevi of trunk: Secondary | ICD-10-CM | POA: Diagnosis not present

## 2022-07-18 DIAGNOSIS — L814 Other melanin hyperpigmentation: Secondary | ICD-10-CM | POA: Diagnosis not present

## 2022-07-18 DIAGNOSIS — L570 Actinic keratosis: Secondary | ICD-10-CM | POA: Diagnosis not present

## 2022-07-18 DIAGNOSIS — Z7189 Other specified counseling: Secondary | ICD-10-CM | POA: Diagnosis not present

## 2022-07-18 DIAGNOSIS — L821 Other seborrheic keratosis: Secondary | ICD-10-CM | POA: Diagnosis not present

## 2022-07-18 DIAGNOSIS — Z08 Encounter for follow-up examination after completed treatment for malignant neoplasm: Secondary | ICD-10-CM | POA: Diagnosis not present

## 2022-07-18 DIAGNOSIS — Z85828 Personal history of other malignant neoplasm of skin: Secondary | ICD-10-CM | POA: Diagnosis not present

## 2022-08-08 ENCOUNTER — Ambulatory Visit (INDEPENDENT_AMBULATORY_CARE_PROVIDER_SITE_OTHER): Payer: Medicare HMO | Admitting: Family

## 2022-08-08 ENCOUNTER — Encounter: Payer: Self-pay | Admitting: Family

## 2022-08-08 VITALS — BP 124/72 | HR 68 | Temp 97.8°F | Ht 66.0 in | Wt 167.8 lb

## 2022-08-08 DIAGNOSIS — S81802A Unspecified open wound, left lower leg, initial encounter: Secondary | ICD-10-CM | POA: Diagnosis not present

## 2022-08-08 MED ORDER — SULFAMETHOXAZOLE-TRIMETHOPRIM 800-160 MG PO TABS: 1.0000 | ORAL_TABLET | Freq: Two times a day (BID) | ORAL | 0 refills | Status: DC

## 2022-08-08 NOTE — Assessment & Plan Note (Addendum)
Wound culture ordered today pending results Rx bactrim for suspected MRSA  Tetanus utd  Pt advised to:  Please monitor site for worsening signs/symptoms of infection to include: increasing redness, increasing tenderness, increase in size, and or pustulant drainage from site. If this is to occur please let me know immediately.

## 2022-08-08 NOTE — Progress Notes (Signed)
Established Patient Office Visit  Subjective:   Patient ID: Misty Vega, female    DOB: 24-Jan-1951  Age: 72 y.o. MRN: [id number]  CC:  Chief Complaint  Patient presents with   bruise    On left lower leg. Injured with an handle of an axe.    HPI: Misty Vega is a 72 y.o. female presenting on 08/08/2022 for bruise (On left lower leg. Injured with an handle of an axe.)   HPI  Last week she was out gardening and she was using an axe, and she brought the axe down and the wooden part hit her leg and blood went everywhere per pt. She does have redness at the site and is having clear discharge. There is pain to the lower leg waking her up at night.   Harder to stand on it for long periods but does not always hurt when she is walking. She does elevate this at night.   No fever  Did have TDAP 11/21/2018.         ROS: Negative unless specifically indicated above in HPI.   Relevant past medical history reviewed and updated as indicated.   Allergies and medications reviewed and updated.   Current Outpatient Medications:    Calcium-Vitamin D-Vitamin K (VIACTIV PO), Take 1 capsule by mouth daily. , Disp: , Rfl:    Multiple Vitamin (MULTIVITAMIN WITH MINERALS) TABS tablet, Take 1 tablet by mouth daily., Disp: , Rfl:    sulfamethoxazole-trimethoprim (BACTRIM DS) 800-160 MG tablet, Take 1 tablet by mouth 2 (two) times daily for 7 days., Disp: 14 tablet, Rfl: 0  No Known Allergies  Objective:   BP 124/72 (BP Location: Right Arm)   Pulse 68   Temp 97.8 F (36.6 C) (Temporal)   Ht 5\' 6"  (1.676 m)   Wt 167 lb 12.8 oz (76.1 kg)   SpO2 98%   BMI 27.08 kg/m    Physical Exam Constitutional:      General: She is not in acute distress.    Appearance: Normal appearance. She is normal weight. She is not ill-appearing, toxic-appearing or diaphoretic.  HENT:     Head: Normocephalic.  Cardiovascular:     Rate and Rhythm: Normal rate.  Pulmonary:     Effort:  Pulmonary effort is normal.  Musculoskeletal:        General: Normal range of motion.  Skin:    Findings: Wound (left lower extremity, flap of skin overlying on left side of anterior wound approximately 6 cm diameter with clear discharge and yellow red discharge, dried within wound, erythema surrounding wound with warmth to touch.) present.  Neurological:     General: No focal deficit present.     Mental Status: She is alert and oriented to person, place, and time. Mental status is at baseline.  Psychiatric:        Mood and Affect: Mood normal.        Behavior: Behavior normal.        Thought Content: Thought content normal.        Judgment: Judgment normal.      Assessment & Plan:  Wound of left lower extremity, initial encounter Assessment & Plan: Wound culture ordered today pending results Rx bactrim for suspected MRSA  Tetanus utd  Pt advised to:  Please monitor site for worsening signs/symptoms of infection to include: increasing redness, increasing tenderness, increase in size, and or pustulant drainage from site. If this is to occur please let me know immediately.  Orders: -     WOUND CULTURE -     Sulfamethoxazole-Trimethoprim; Take 1 tablet by mouth 2 (two) times daily for 7 days.  Dispense: 14 tablet; Refill: 0     Follow up plan: Return in about 1 week (around 08/15/2022) for f/u wound.  Mort Sawyers, FNP

## 2022-08-11 LAB — WOUND CULTURE
MICRO NUMBER:: 14812397
SPECIMEN QUALITY:: ADEQUATE

## 2022-08-12 ENCOUNTER — Other Ambulatory Visit: Payer: Self-pay | Admitting: Family

## 2022-08-12 DIAGNOSIS — S81802A Unspecified open wound, left lower leg, initial encounter: Secondary | ICD-10-CM

## 2022-08-12 MED ORDER — CIPROFLOXACIN HCL 500 MG PO TABS: 500.0000 mg | ORAL_TABLET | Freq: Two times a day (BID) | ORAL | 0 refills | Status: DC

## 2022-08-12 NOTE — Progress Notes (Signed)
Not sure if current antibiotic will be working for this wound. Changing to ciprofloxacin as this came back from the culture.  Pick up new antbx from pharmacy, stop current one and start this instead.  How is wound looking, any improvement over the weekend?

## 2022-08-16 ENCOUNTER — Ambulatory Visit (INDEPENDENT_AMBULATORY_CARE_PROVIDER_SITE_OTHER): Payer: Medicare HMO | Admitting: Family

## 2022-08-16 ENCOUNTER — Encounter: Payer: Self-pay | Admitting: Family

## 2022-08-16 VITALS — BP 130/72 | HR 79 | Temp 97.7°F | Ht 66.0 in | Wt 165.2 lb

## 2022-08-16 DIAGNOSIS — S81802A Unspecified open wound, left lower leg, initial encounter: Secondary | ICD-10-CM

## 2022-08-16 MED ORDER — LEVOFLOXACIN 750 MG PO TABS: 750.0000 mg | ORAL_TABLET | Freq: Every day | ORAL | 0 refills | Status: AC

## 2022-08-16 NOTE — Progress Notes (Signed)
   Established Patient Office Visit  Subjective:   Patient ID: Misty Vega, female    DOB: 1951-04-23  Age: 72 y.o. MRN: [id number]  CC:  Chief Complaint  Patient presents with   Wound Check    HPI: Misty Vega is a 72 y.o. female presenting on 08/16/2022 for Wound Check     Wound Check   Left anterior shin onboard with ciprofloxacin however progression of erythema to the site.  There is still present yellow discharge. Denies fever/chills      ROS: Negative unless specifically indicated above in HPI.   Relevant past medical history reviewed and updated as indicated.   Allergies and medications reviewed and updated.   Current Outpatient Medications:    Calcium-Vitamin D-Vitamin K (VIACTIV PO), Take 1 capsule by mouth daily. , Disp: , Rfl:    levofloxacin (LEVAQUIN) 750 MG tablet, Take 1 tablet (750 mg total) by mouth daily for 10 days., Disp: 10 tablet, Rfl: 0   Multiple Vitamin (MULTIVITAMIN WITH MINERALS) TABS tablet, Take 1 tablet by mouth daily., Disp: , Rfl:   No Known Allergies  Objective:   BP 130/72   Pulse 79   Temp 97.7 F (36.5 C) (Temporal)   Ht  (1.676 m)   Wt 165 lb 3.2 oz (74.9 kg)   SpO2 98%   BMI 26.66 kg/m    Physical Exam Skin:    Findings: Wound (left anterior leg with yellow discharge and surrounding erythema which is increasing in size) present.     Comments: Annular wound with height at once inch and width at 6 mm       Assessment & Plan:  Wound of left lower extremity, initial encounter Assessment & Plan: Worsening on cipro Confirmed correct medication for wound  Will change however to levaquin with progression of disease  Advised to monitor for worsening infection  Am going to refer to wound care in case f/u 3 days with no improvement  Monitor for fever/chills   Did advise pt of flouroquinolone black box warning    Orders: -     levoFLOXacin; Take 1 tablet (750 mg total) by mouth daily for 10  days.  Dispense: 10 tablet; Refill: 0 -     Ambulatory referral to Wound Clinic     Follow up plan: Return in about 3 days (around 08/19/2022) for f/u wound.  Mort Sawyers, FNP

## 2022-08-16 NOTE — Assessment & Plan Note (Addendum)
Worsening on cipro Confirmed correct medication for wound  Will change however to levaquin with progression of disease  Advised to monitor for worsening infection  Am going to refer to wound care in case f/u 3 days with no improvement  Monitor for fever/chills   Did advise pt of flouroquinolone black box warning

## 2022-08-16 NOTE — Patient Instructions (Signed)
Stop ciprofloxacin and start levaquin   Please monitor site for worsening signs/symptoms of infection to include: increasing redness, increasing tenderness, increase in size, and or pustulant drainage from site. If this is to occur please let me know immediately.    Regards,   Mort Sawyers FNP-C

## 2022-08-20 ENCOUNTER — Ambulatory Visit: Payer: Medicare HMO | Admitting: Family

## 2022-08-21 ENCOUNTER — Encounter (HOSPITAL_BASED_OUTPATIENT_CLINIC_OR_DEPARTMENT_OTHER): Payer: Medicare HMO | Attending: General Surgery | Admitting: Physician Assistant

## 2022-08-21 ENCOUNTER — Telehealth: Payer: Self-pay | Admitting: Family

## 2022-08-21 DIAGNOSIS — S81812A Laceration without foreign body, left lower leg, initial encounter: Secondary | ICD-10-CM | POA: Insufficient documentation

## 2022-08-21 DIAGNOSIS — W228XXA Striking against or struck by other objects, initial encounter: Secondary | ICD-10-CM | POA: Diagnosis not present

## 2022-08-21 DIAGNOSIS — S80812A Abrasion, left lower leg, initial encounter: Secondary | ICD-10-CM | POA: Diagnosis not present

## 2022-08-21 DIAGNOSIS — L97822 Non-pressure chronic ulcer of other part of left lower leg with fat layer exposed: Secondary | ICD-10-CM | POA: Diagnosis not present

## 2022-08-21 DIAGNOSIS — S8012XA Contusion of left lower leg, initial encounter: Secondary | ICD-10-CM | POA: Diagnosis not present

## 2022-08-21 NOTE — Telephone Encounter (Signed)
No I am ok with her cancelling my appointment and following up with wound care. Glad to hear she got in so quickly.

## 2022-08-21 NOTE — Telephone Encounter (Signed)
Patient contacted the office regarding appointment on 4/25, states she has seen a provider at a wound care center and he treated the wound on her leg. States that he wants to keep her on antibiotic she is prescribed. Patient cancelled appointment for wound recheck tomorrow due to seeing wound care provider today, wanted to know if Tabitha still wanted her to come in regarding this issue to keep an eye on it. States she is feeling better and likes wound care provider she is seeing but will reschedule if Wyatt Mage still wants to see her. Please advise, thank you.

## 2022-08-22 ENCOUNTER — Ambulatory Visit: Payer: Medicare HMO | Admitting: Family

## 2022-08-22 NOTE — Telephone Encounter (Signed)
Spoke with the patient and advised Misty Vega's response.

## 2022-08-23 NOTE — Progress Notes (Signed)
Misty Vega, Misty Vega ([id number]) [id number] Page 1 of 7 Visit Report for 08/21/2022 Chief Complaint Document Details Patient Name: Date of Service: Misty Vega, Misty Vega 08/21/2022 10:00 A M Medical Record Number: [id number] Patient Account Number: [id number] Date of Birth/Sex: Treating RN: 1950-06-03 (72 y.o. F) Primary Care Provider: Mort Sawyers Other Clinician: Referring Provider: Treating Provider/Extender: Suella Grove, Tabitha Weeks in Treatment: 0 Information Obtained from: Patient Chief Complaint Left leg ulcer Electronic Signature(s) Signed: 08/21/2022 10:53:52 AM By: Allen Derry PA-C Entered By: Allen Derry on 08/21/2022 10:53:52 -------------------------------------------------------------------------------- Debridement Details Patient Name: Date of Service: Misty Vega J. 08/21/2022 10:00 A M Medical Record Number: [id number] Patient Account Number: [id number] Date of Birth/Sex: Treating RN: 1950-05-07 (72 y.o. Misty Vega, Misty Vega Primary Care Provider: Mort Sawyers Other Clinician: Referring Provider: Treating Provider/Extender: Daisey Must Weeks in Treatment: 0 Debridement Performed for Assessment: Wound #1 Left,Anterior Lower Leg Performed By: Physician Lenda Kelp, PA Debridement Type: Debridement Level of Consciousness (Pre-procedure): Awake and Alert Pre-procedure Verification/Time Out Yes - 10:55 Taken: Start Time: 10:56 Pain Control: Lidocaine 5% topical ointment Percent of Wound Bed Debrided: 50% T Area Debrided (cm): otal 1.06 Tissue and other material debrided: Non-Viable, Eschar Level: Non-Viable Tissue Debridement Description: Selective/Open Wound Instrument: Curette Bleeding: Minimum End Time: 11:01 Procedural Pain: 0 Post Procedural Pain: 0 Response to Treatment: Procedure was tolerated well Level of Consciousness (Post- Awake and Alert procedure): Post Debridement Measurements of  Total Wound Length: (cm) 1.8 Width: (cm) 1.5 Depth: (cm) 0.2 Volume: (cm) 0.424 Character of Wound/Ulcer Post Debridement: Requires Further Debridement Post Procedure Diagnosis Same as Pre-procedure Electronic Signature(s) Signed: 08/21/2022 5:22:52 PM By: Allen Derry PA-C Signed: 08/22/2022 4:51:53 PM By: Shawn Stall RN, BSN Entered By: Shawn Stall on 08/21/2022 11:03:02 Misty Vega ([id number]) [id number] Page 2 of 7 -------------------------------------------------------------------------------- HPI Details Patient Name: Date of Service: Misty Vega 08/21/2022 10:00 A M Medical Record Number: [id number] Patient Account Number: [id number] Date of Birth/Sex: Treating RN: 07/03/1950 (72 y.o. F) Primary Care Provider: Mort Sawyers Other Clinician: Referring Provider: Treating Provider/Extender: Suella Grove, Tabitha Weeks in Treatment: 0 History of Present Illness HPI Description: 08-21-2022 upon evaluation today patient presents for initial inspection here in our clinic concerning issue that occurred on April 1 when she was outside chopping monkey grass with an ax. She tells me that she went to swing the ax and inadvertently hit herself in the anterior portion of the shin. The handle is what hit her thank goodness not the ax head. Nonetheless she has been having issues try to get this healed since that time has now been about 3 weeks just a little bit over. She initially was on antibiotics she is not sure the name however this had to be changed after the culture showed that she had Pseudomonas present she was switched to Levaquin and she has been taking that since Friday which is about 4 to 5 days ago. She does have a current regimen of use an antibiotic ointment after cleaning with soap and water. She uses a hypoallergenic soap. Fortunately I do not see any signs of active infection locally or systemically which is great news. She  does have some irritation around the edges of the wound I think this may be actually allergic reaction to the neomycin and the Neosporin as everything else she is using is really hypoallergenic. The patient does have a history of minimal swelling of her legs but otherwise no major medical problems. Electronic  Signature(s) Signed: 08/21/2022 1:04:42 PM By: Allen Derry PA-C Entered By: Allen Derry on 08/21/2022 13:04:42 -------------------------------------------------------------------------------- Physical Exam Details Patient Name: Date of Service: Misty Vega, Misty Vega 08/21/2022 10:00 A M Medical Record Number: [id number] Patient Account Number: [id number] Date of Birth/Sex: Treating RN: [birth year] (72 y.o. F) Primary Care Provider: Mort Sawyers Other Clinician: Referring Provider: Treating Provider/Extender: Suella Grove, Tabitha Weeks in Treatment: 0 Constitutional patient is hypertensive.. pulse regular and within target range for patient.Marland Kitchen respirations regular, non-labored and within target range for patient.Marland Kitchen temperature within target range for patient.. Well-nourished and well-hydrated in no acute distress. Eyes conjunctiva clear no eyelid edema noted. pupils equal round and reactive to light and accommodation. Ears, Nose, Mouth, and Throat no gross abnormality of ear auricles or external auditory canals. normal hearing noted during conversation. mucus membranes moist. Respiratory normal breathing without difficulty. Cardiovascular 2+ dorsalis pedis/posterior tibialis pulses. trace pitting edema of the bilateral lower extremities. Musculoskeletal normal gait and posture. no significant deformity or arthritic changes, no loss or range of motion, no clubbing. Psychiatric this patient is able to make decisions and demonstrates good insight into disease process. Alert and Oriented x 3. pleasant and cooperative. Notes Upon inspection patient's wound bed actually showed signs  of fairly good granulation in some areas there is also some necrotic tissue and others this does need to be removed. Fortunately I do not see any evidence of infection systemically locally I feel like this is doing better with erythema around the edges of the wound seems to be itching more I think she may actually be having allergic reaction to the neomycin and the Neosporin. I discussed that with her today. With that being said I do believe that she would benefit from discontinuing that anyway regular use of the Xeroform gauze dressing so she will not need any topical antibiotics. Electronic Signature(s) Signed: 08/21/2022 1:05:27 PM By: Allen Derry PA-C Entered By: Allen Derry on 08/21/2022 13:05:27 KENIDEE, CREGAN ([id number]) [id number] Page 3 of 7 -------------------------------------------------------------------------------- Physician Orders Details Patient Name: Date of Service: Misty Vega, Misty Vega 08/21/2022 10:00 A M Medical Record Number: [id number] Patient Account Number: [id number] Date of Birth/Sex: Treating RN: May 20, 1950 (72 y.o. Misty Vega, [name] Primary Care Provider: Mort Sawyers Other Clinician: Referring Provider: Treating Provider/Extender: Suella Grove, Tabitha Weeks in Treatment: 0 Verbal / Phone Orders: No Diagnosis Coding ICD-10 Coding Code Description [id number] Laceration without foreign body, left lower leg, initial encounter L97.822 Non-pressure chronic ulcer of other part of left lower leg with fat layer exposed S80.12XA Contusion of left lower leg, initial encounter Follow-up Appointments ppointment in 1 week. Allen Derry, [place] Wednesday 215pm 08/28/2022 room 8 Return A ppointment in 2 weeks. Allen Derry PA, Wednesday 09/04/2022 0845 room 8 Return A Other: - [facility]- wound care supplier. TCA cream applied to around the wound bed-pick up from pharmacy. Anesthetic (In clinic) Topical Lidocaine 5% applied to wound  bed Bathing/ Shower/ Hygiene May shower and wash wound with soap and water. Edema Control - Lymphedema / SCD / Other Elevate legs to the level of the heart or above for 30 minutes daily and/or when sitting for 3-4 times a day throughout the day. Avoid standing for long periods of time. Exercise regularly Moisturize legs daily. - every night before bed. Wound Treatment Wound #1 - Lower Leg Wound Laterality: Left, Anterior Cleanser: Soap and Water 1 x Per Day/30 Days Discharge Instructions: May shower and wash wound with dial antibacterial soap and water prior to  dressing change. Cleanser: [facility] Ancillary Kit - 15 Day Supply (DME) (Generic) 1 x Per Day/30 Days Discharge Instructions: Use supplies as instructed; Kit contains: (15) Saline Bullets; (15) 3x3 Gauze; 15 pr Gloves Peri-Wound Care: Skin Prep (DME) (Generic) 1 x Per Day/30 Days Discharge Instructions: Use skin prep as directed Peri-Wound Care: Triamcinolone 15 (g) 1 x Per Day/30 Days Discharge Instructions: Use triamcinolone apply around the wound. Prim Dressing: Xeroform Occlusive Gauze Dressing, 4x4 in (DME) (Generic) 1 x Per Day/30 Days ary Discharge Instructions: Apply to wound bed as instructed Secondary Dressing: Zetuvit Plus Silicone Border Dressing 4x4 (in/in) (DME) (Generic) 1 x Per Day/30 Days Discharge Instructions: Apply silicone border over primary dressing as directed. Patient Medications llergies: No Known Drug Allergies A Notifications Medication Indication Start End for debridements in 08/21/2022 lidocaine clinic. DOSE topical 5 % cream - cream topical once daily 08/21/2022 triamcinolone acetonide DOSE topical 0.1 % ointment - ointment topical twice a day to red and itchy skin on the left leg x 30 days Electronic Signature(s) Signed: 08/21/2022 1:07:14 PM By: Allen Derry PA-C Entered By: Allen Derry on 08/21/2022 13:07:14 Misty Vega ([id number]) [id number] Page 4 of  7 -------------------------------------------------------------------------------- Problem List Details Patient Name: Date of Service: Misty Vega, Misty Vega 08/21/2022 10:00 A M Medical Record Number: [id number] Patient Account Number: [id number] Date of Birth/Sex: Treating RN: 1951-01-24 (72 y.o. Arta Silence Primary Care Provider: Mort Sawyers Other Clinician: Referring Provider: Treating Provider/Extender: Suella Grove, Tabitha Weeks in Treatment: 0 Active Problems ICD-10 Encounter Code Description Active Date MDM Diagnosis S81.812A Laceration without foreign body, left lower leg, initial encounter 08/21/2022 No Yes L97.822 Non-pressure chronic ulcer of other part of left lower leg with fat layer exposed4/24/2024 No Yes S80.12XA Contusion of left lower leg, initial encounter 08/21/2022 No Yes Inactive Problems Resolved Problems Electronic Signature(s) Signed: 08/21/2022 10:53:43 AM By: Allen Derry PA-C Entered By: Allen Derry on 08/21/2022 10:53:43 -------------------------------------------------------------------------------- Progress Note Details Patient Name: Date of Service: Misty Rua. 08/21/2022 10:00 A M Medical Record Number: [id number] Patient Account Number: [id number] Date of Birth/Sex: Treating RN: 26-Feb-1951 (72 y.o. F) Primary Care Provider: Mort Sawyers Other Clinician: Referring Provider: Treating Provider/Extender: Suella Grove, Tabitha Weeks in Treatment: 0 Subjective Chief Complaint Information obtained from Patient Left leg ulcer History of Present Illness (HPI) 08-21-2022 upon evaluation today patient presents for initial inspection here in our clinic concerning issue that occurred on April 1 when she was outside chopping monkey grass with an ax. She tells me that she went to swing the ax and inadvertently hit herself in the anterior portion of the shin. The handle is what hit her thank goodness not the ax head. Nonetheless she  has been having issues try to get this healed since that time has now been about 3 weeks just a little bit over. She initially was on antibiotics she is not sure the name however this had to be changed after the culture showed that she had Pseudomonas present she was switched to Levaquin and she has been taking that since Friday which is about 4 to 5 days ago. She does have a current regimen of use an antibiotic ointment after cleaning with soap and water. She uses a hypoallergenic soap. Fortunately I do not see any signs of active infection locally or systemically which is great news. She does have some irritation around the edges of the wound I think this may be actually allergic reaction to the neomycin and the Neosporin as  everything else she is using is really hypoallergenic. The patient does have a history of minimal swelling of her legs but otherwise no major medical problems. Patient History Allergies No Known Drug Allergies Family History Cancer - Mother, Hypertension - Father. Social History JANIYA, MILLIRONS ([id number]) [id number] Page 5 of 7 Never smoker, Marital Status - Married. Review of Systems (ROS) Integumentary (Skin) Complains or has symptoms of Wounds - left leg wound, 08/08/2022 wound culture positive pseudomonas Objective Constitutional patient is hypertensive.. pulse regular and within target range for patient.Marland Kitchen respirations regular, non-labored and within target range for patient.Marland Kitchen temperature within target range for patient.. Well-nourished and well-hydrated in no acute distress. Vitals Time Taken: 10:27 AM, Height: 65 in, Source: Stated, Weight: 164 lbs, Source: Stated, BMI: 27.3, Temperature: 97.9 F, Pulse: 69 bpm, Respiratory Rate: 16 breaths/min, Blood Pressure: 162/91 mmHg. Eyes conjunctiva clear no eyelid edema noted. pupils equal round and reactive to light and accommodation. Ears, Nose, Mouth, and Throat no gross abnormality  of ear auricles or external auditory canals. normal hearing noted during conversation. mucus membranes moist. Respiratory normal breathing without difficulty. Cardiovascular 2+ dorsalis pedis/posterior tibialis pulses. trace pitting edema of the bilateral lower extremities. Musculoskeletal normal gait and posture. no significant deformity or arthritic changes, no loss or range of motion, no clubbing. Psychiatric this patient is able to make decisions and demonstrates good insight into disease process. Alert and Oriented x 3. pleasant and cooperative. General Notes: Upon inspection patient's wound bed actually showed signs of fairly good granulation in some areas there is also some necrotic tissue and others this does need to be removed. Fortunately I do not see any evidence of infection systemically locally I feel like this is doing better with erythema around the edges of the wound seems to be itching more I think she may actually be having allergic reaction to the neomycin and the Neosporin. I discussed that with her today. With that being said I do believe that she would benefit from discontinuing that anyway regular use of the Xeroform gauze dressing so she will not need any topical antibiotics. Integumentary (Hair, Skin) Wound #1 status is Open. Original cause of wound was Trauma. The date acquired was: 07/29/2022. The wound is located on the Left,Anterior Lower Leg. The wound measures 1.8cm length x 1.5cm width x 0.1cm depth; 2.121cm^2 area and 0.212cm^3 volume. There is Fat Layer (Subcutaneous Tissue) exposed. There is no tunneling or undermining noted. There is a medium amount of serosanguineous drainage noted. The wound margin is distinct with the outline attached to the wound base. There is no granulation within the wound bed. There is a large (67-100%) amount of necrotic tissue within the wound bed including Eschar and Adherent Slough. The periwound skin appearance exhibited: Erythema.  The periwound skin appearance did not exhibit: Callus, Crepitus, Excoriation, Induration, Rash, Scarring, Dry/Scaly, Maceration, Atrophie Blanche, Cyanosis, Ecchymosis, Hemosiderin Staining, Mottled, Pallor, Rubor. The surrounding wound skin color is noted with erythema which is circumferential. Periwound temperature was noted as No Abnormality. The periwound has tenderness on palpation. Assessment Active Problems ICD-10 Laceration without foreign body, left lower leg, initial encounter Non-pressure chronic ulcer of other part of left lower leg with fat layer exposed Contusion of left lower leg, initial encounter Procedures Wound #1 Pre-procedure diagnosis of Wound #1 is an Abrasion located on the Left,Anterior Lower Leg . There was a Selective/Open Wound Non-Viable Tissue Debridement with a total area of 1.06 sq cm performed by Lenda Kelp, PA. With the following instrument(s): Curette  to remove Non-Viable tissue/material. Material removed includes Eschar after achieving pain control using Lidocaine 5% topical ointment. A time out was conducted at 10:55, prior to the start of the procedure. A Minimum amount of bleeding was controlled with N/A. The procedure was tolerated well with a pain level of 0 throughout and a pain level of 0 following the procedure. Post Debridement Measurements: 1.8cm length x 1.5cm width x 0.2cm depth; 0.424cm^3 volume. Character of Wound/Ulcer Post Debridement requires further debridement. Post procedure Diagnosis Wound #1: Same as Pre-Procedure Misty Vega, Misty Vega ([id number]) [id number] Page 6 of 7 Plan Follow-up Appointments: Return Appointment in 1 week. Allen Derry, [place] Wednesday 215pm 08/28/2022 room 8 Return Appointment in 2 weeks. Allen Derry PA, Wednesday 09/04/2022 0845 room 8 Other: - [facility]- wound care supplier. TCA cream applied to around the wound bed-pick up from pharmacy. Anesthetic: (In clinic) Topical Lidocaine 5%  applied to wound bed Bathing/ Shower/ Hygiene: May shower and wash wound with soap and water. Edema Control - Lymphedema / SCD / Other: Elevate legs to the level of the heart or above for 30 minutes daily and/or when sitting for 3-4 times a day throughout the day. Avoid standing for long periods of time. Exercise regularly Moisturize legs daily. - every night before bed. The following medication(s) was prescribed: lidocaine topical 5 % cream cream topical once daily for for debridements in clinic. was prescribed at facility triamcinolone acetonide topical 0.1 % ointment ointment topical twice a day to red and itchy skin on the left leg x 30 days starting 08/21/2022 WOUND #1: - Lower Leg Wound Laterality: Left, Anterior Cleanser: Soap and Water 1 x Per Day/30 Days Discharge Instructions: May shower and wash wound with dial antibacterial soap and water prior to dressing change. Cleanser: [facility] Ancillary Kit - 15 Day Supply (DME) (Generic) 1 x Per Day/30 Days Discharge Instructions: Use supplies as instructed; Kit contains: (15) Saline Bullets; (15) 3x3 Gauze; 15 pr Gloves Peri-Wound Care: Skin Prep (DME) (Generic) 1 x Per Day/30 Days Discharge Instructions: Use skin prep as directed Peri-Wound Care: Triamcinolone 15 (g) 1 x Per Day/30 Days Discharge Instructions: Use triamcinolone apply around the wound. Prim Dressing: Xeroform Occlusive Gauze Dressing, 4x4 in (DME) (Generic) 1 x Per Day/30 Days ary Discharge Instructions: Apply to wound bed as instructed Secondary Dressing: Zetuvit Plus Silicone Border Dressing 4x4 (in/in) (DME) (Generic) 1 x Per Day/30 Days Discharge Instructions: Apply silicone border over primary dressing as directed. 1. I am going to recommend that we go ahead and initiate treatment with a Xeroform gauze dressing. 2. I am also going to suggest that the patient should use a bordered foam dressing to cover. 3. I would recommend that she not use any more than Neosporin I am  going to send her in a prescription today for the triamcinolone ointment which I think will do much better. We will see patient back for reevaluation in 1 week here in the clinic. If anything worsens or changes patient will contact our office for additional recommendations. Electronic Signature(s) Signed: 08/21/2022 1:07:29 PM By: Allen Derry PA-C Entered By: Allen Derry on 08/21/2022 13:07:29 -------------------------------------------------------------------------------- HxROS Details Patient Name: Date of Service: Misty Vega J. 08/21/2022 10:00 A M Medical Record Number: [id number] Patient Account Number: [id number] Date of Birth/Sex: Treating RN: 1950/10/17 (71 y.o. Arta Silence Primary Care Provider: Mort Sawyers Other Clinician: Referring Provider: Treating Provider/Extender: Suella Grove, Tabitha Weeks in Treatment: 0 Integumentary (Skin) Complaints and Symptoms: Positive for: Wounds - left  leg wound Review of System Notes: 08/08/2022 wound culture positive pseudomonas Immunizations Pneumococcal Vaccine: Received Pneumococcal Vaccination: No Implantable Devices No devices added Family and Social History Cancer: Yes - Mother; Hypertension: Yes - Father; Never smoker; Marital Status - Married; Financial Concerns: No; Food, Clothing or Shelter Needs: No; Support System Lacking: No; Transportation Concerns: No Misty Vega, Misty Vega ([id number]) [id number] Page 7 of 7 Electronic Signature(s) Signed: 08/21/2022 5:22:52 PM By: Allen Derry PA-C Signed: 08/22/2022 4:51:53 PM By: Shawn Stall RN, BSN Entered By: Shawn Stall on 08/21/2022 07:55:42 -------------------------------------------------------------------------------- SuperBill Details Patient Name: Date of Service: Misty Rua 08/21/2022 Medical Record Number: [id number] Patient Account Number: [id number] Date of Birth/Sex: Treating RN: 07-02-1950 (72 y.o. Misty Vega,  Misty Vega Primary Care Provider: Mort Sawyers Other Clinician: Referring Provider: Treating Provider/Extender: Suella Grove, Tabitha Weeks in Treatment: 0 Diagnosis Coding ICD-10 Codes Code Description [phone] Laceration without foreign body, left lower leg, initial encounter L97.822 Non-pressure chronic ulcer of other part of left lower leg with fat layer exposed S80.12XA Contusion of left lower leg, initial encounter Facility Procedures : CPT4 Code: [id number] Description: 99213 - WOUND CARE VISIT-LEV 3 EST PT Modifier: Quantity: 1 : CPT4 Code: [id number] Description: 97597 - DEBRIDE WOUND 1ST 20 SQ CM OR < ICD-10 Diagnosis Description L97.822 Non-pressure chronic ulcer of other part of left lower leg with fat layer expose S81.812A Laceration without foreign body, left lower leg, initial encounter  S80.12XA Contusion of left lower leg, initial encounter Modifier: d Quantity: 1 Physician Procedures : CPT4 Code Description Modifier [id number] 97597 - WC PHYS DEBR WO ANESTH 20 SQ CM ICD-10 Diagnosis Description L97.822 Non-pressure chronic ulcer of other part of left lower leg with fat layer exposed S81.812A Laceration without foreign body, left lower  leg, initial encounter S80.12XA Contusion of left lower leg, initial encounter Quantity: 1 Electronic Signature(s) Signed: 08/21/2022 1:08:04 PM By: Allen Derry PA-C Entered By: Allen Derry on 08/21/2022 13:08:03

## 2022-08-23 NOTE — Progress Notes (Signed)
Misty Vega, Misty Vega ([id number]) [id number] Page 1 of 8 Visit Report for 08/21/2022 Allergy List Details Patient Name: Date of Service: Misty Vega, Misty Vega 08/21/2022 10:00 A M Medical Record Number: [id number] Patient Account Number: [id number] Date of Birth/Sex: Treating RN: [birth year] (72 y.o. Arta Silence Primary Care Cosimo Schertzer: Mort Sawyers Other Clinician: Referring Anshi Jalloh: Treating Maysa Lynn/Extender: Suella Grove, Tabitha Weeks in Treatment: 0 Allergies Active Allergies No Known Drug Allergies Allergy Notes Electronic Signature(s) Signed: 08/22/2022 4:51:53 PM By: Shawn Stall RN, BSN Entered By: Shawn Stall on 08/21/2022 07:53:26 -------------------------------------------------------------------------------- Arrival Information Details Patient Name: Date of Service: Misty Calamity J. 08/21/2022 10:00 A M Medical Record Number: [id number] Patient Account Number: [id number] Date of Birth/Sex: Treating RN: 1951-03-11 (72 y.o. Arta Silence Primary Care Raevon Broom: Mort Sawyers Other Clinician: Referring Jashawn Floyd: Treating Allyiah Gartner/Extender: Suella Grove, Tabitha Weeks in Treatment: 0 Visit Information Patient Arrived: Ambulatory Arrival Time: 10:26 Accompanied By: husband Transfer Assistance: None Patient Identification Verified: Yes Secondary Verification Process Completed: Yes Patient Requires Transmission-Based Precautions: No Patient Has Alerts: No Electronic Signature(s) Signed: 08/22/2022 4:51:53 PM By: Shawn Stall RN, BSN Entered By: Shawn Stall on 08/21/2022 10:27:39 -------------------------------------------------------------------------------- Clinic Level of Care Assessment Details Patient Name: Date of Service: Misty Vega, Misty Vega 08/21/2022 10:00 A M Medical Record Number: [id number] Patient Account Number: [id number] Date of Birth/Sex: Treating RN: Mar 28, 1951 (72 y.o. Arta Silence Primary Care  Tor Tsuda: Mort Sawyers Other Clinician: Referring Wanette Robison: Treating Baker Moronta/Extender: Suella Grove, Tabitha Weeks in Treatment: 0 Clinic Level of Care Assessment Items TOOL 1 Quantity Score X- 1 0 Use when EandM and Procedure is performed on INITIAL visit ASSESSMENTS - Nursing Assessment / Reassessment X- 1 20 General Physical Exam (combine w/ comprehensive assessment (listed just below) when performed on new pt. evals) X- 1 25 Comprehensive Assessment (HX, ROS, Risk Assessments, Wounds Hx, etc.) Misty Vega, Misty Vega ([id number]) [phone].pdf Page 2 of 8 ASSESSMENTS - Wound and Skin Assessment / Reassessment X- 1 10 Dermatologic / Skin Assessment (not related to wound area) ASSESSMENTS - Ostomy and/or Continence Assessment and Care []  - 0 Incontinence Assessment and Management []  - 0 Ostomy Care Assessment and Management (repouching, etc.) PROCESS - Coordination of Care X - Simple Patient / Family Education for ongoing care 1 15 []  - 0 Complex (extensive) Patient / Family Education for ongoing care X- 1 10 Staff obtains Chiropractor, Records, T Results / Process Orders est []  - 0 Staff telephones HHA, Nursing Homes / Clarify orders / etc []  - 0 Routine Transfer to another Facility (non-emergent condition) []  - 0 Routine Hospital Admission (non-emergent condition) X- 1 15 New Admissions / Manufacturing engineer / Ordering NPWT Apligraf, etc. , []  - 0 Emergency Hospital Admission (emergent condition) PROCESS - Special Needs []  - 0 Pediatric / Minor Patient Management []  - 0 Isolation Patient Management []  - 0 Hearing / Language / Visual special needs []  - 0 Assessment of Community assistance (transportation, D/C planning, etc.) []  - 0 Additional assistance / Altered mentation []  - 0 Support Surface(s) Assessment (bed, cushion, seat, etc.) INTERVENTIONS - Miscellaneous []  - 0 External ear exam []  - 0 Patient Transfer (multiple  staff / Nurse, adult / Similar devices) []  - 0 Simple Staple / Suture removal (25 or less) []  - 0 Complex Staple / Suture removal (26 or more) []  - 0 Hypo/Hyperglycemic Management (do not check if billed separately) X- 1 15 Ankle / Brachial Index (ABI) - do not check if billed separately Has the  patient been seen at the hospital within the last three years: Yes Total Score: 110 Level Of Care: New/Established - Level 3 Electronic Signature(s) Signed: 08/22/2022 4:51:53 PM By: Shawn Stall RN, BSN Entered By: Shawn Stall on 08/21/2022 11:05:34 -------------------------------------------------------------------------------- Encounter Discharge Information Details Patient Name: Date of Service: Misty Calamity J. 08/21/2022 10:00 A M Medical Record Number: [id number] Patient Account Number: [id number] Date of Birth/Sex: Treating RN: May 11, 1950 (71 y.o. Arta Silence Primary Care Mateus Rewerts: Mort Sawyers Other Clinician: Referring Nikko Goldwire: Treating Cindy Fullman/Extender: Daisey Must Weeks in Treatment: 0 Encounter Discharge Information Items Post Procedure Vitals Discharge Condition: Stable Temperature (F): 97.9 Ambulatory Status: Ambulatory Pulse (bpm): 69 Discharge Destination: Home Respiratory Rate (breaths/min): 18 Transportation: Private Auto Blood Pressure (mmHg): 162/91 Accompanied By: husband Schedule Follow-up Appointment: Yes Clinical Summary of Care: Misty Vega, Misty Vega ([id number]) [id number] Page 3 of 8 Electronic Signature(s) Signed: 08/22/2022 4:51:53 PM By: Shawn Stall RN, BSN Entered By: Shawn Stall on 08/21/2022 11:06:21 -------------------------------------------------------------------------------- Lower Extremity Assessment Details Patient Name: Date of Service: Misty Vega, Misty Vega 08/21/2022 10:00 A M Medical Record Number: [id number] Patient Account Number: [id number] Date of Birth/Sex: Treating RN: [birth year]  (72 y.o. [name], Yvonne Kendall Primary Care Glender Augusta: Mort Sawyers Other Clinician: Referring Eriyah Fernando: Treating Emerson Schreifels/Extender: Suella Grove, Tabitha Weeks in Treatment: 0 Edema Assessment Assessed: [Left: Yes] [Right: No] Edema: [Left: Ye] [Right: s] Calf Left: Right: Point of Measurement: 37 cm From Medial Instep 37.5 cm Ankle Left: Right: Point of Measurement: 8 cm From Medial Instep 23 cm Knee To Floor Left: Right: From Medial Instep 43 cm Vascular Assessment Pulses: Dorsalis Pedis Palpable: [Left:Yes] Doppler Audible: [Left:Yes] Posterior Tibial Palpable: [Left:Yes] Doppler Audible: [Left:Yes] Blood Pressure: Brachial: [Left:162] Ankle: [Left:Dorsalis Pedis: 170 1.05] Electronic Signature(s) Signed: 08/22/2022 4:51:53 PM By: Shawn Stall RN, BSN Entered By: Shawn Stall on 08/21/2022 10:37:04 -------------------------------------------------------------------------------- Multi-Disciplinary Care Plan Details Patient Name: Date of Service: Misty Calamity J. 08/21/2022 10:00 A M Medical Record Number: [id number] Patient Account Number: [id number] Date of Birth/Sex: Treating RN: 10-Sep-1950 (72 y.o. Arta Silence Primary Care Rasha Ibe: Mort Sawyers Other Clinician: Referring Nikie Cid: Treating Talayia Hjort/Extender: Suella Grove, Tabitha Weeks in Treatment: 0 Active Inactive Orientation to the Wound Care Program Nursing Diagnoses: Knowledge deficit related to the wound healing center program TYRONDA, VIZCARRONDO ([id number]) [id number] Page 4 of 8 Goals: Patient/caregiver will verbalize understanding of the Wound Healing Center Program Date Initiated: 08/21/2022 Target Resolution Date: 09/06/2022 Goal Status: Active Interventions: Provide education on orientation to the wound center Notes: Pain, Acute or Chronic Nursing Diagnoses: Pain, acute or chronic: actual or potential Potential alteration in comfort,  pain Goals: Patient will verbalize adequate pain control and receive pain control interventions during procedures as needed Date Initiated: 08/21/2022 Target Resolution Date: 09/06/2022 Goal Status: Active Patient/caregiver will verbalize comfort level met Date Initiated: 08/21/2022 Target Resolution Date: 09/05/2022 Goal Status: Active Interventions: Encourage patient to take pain medications as prescribed Provide education on pain management Reposition patient for comfort Treatment Activities: Administer pain control measures as ordered : 08/21/2022 Notes: Soft Tissue Infection Nursing Diagnoses: Knowledge deficit related to home infection control: handwashing, handling of soiled dressings, supply storage Goals: Patient will remain free of wound infection Date Initiated: 08/21/2022 Target Resolution Date: 09/06/2022 Goal Status: Active Signs and symptoms of infection will be recognized early to allow for prompt treatment Date Initiated: 08/21/2022 Target Resolution Date: 09/06/2022 Goal Status: Active Interventions: Assess signs and symptoms of infection every visit Provide education on infection Treatment Activities:  Culture : 08/14/2022 Culture and sensitivity : 08/21/2022 Systemic antibiotics : 08/21/2022 Notes: Wound/Skin Impairment Nursing Diagnoses: Knowledge deficit related to ulceration/compromised skin integrity Goals: Patient/caregiver will verbalize understanding of skin care regimen Date Initiated: 08/21/2022 Target Resolution Date: 09/06/2022 Goal Status: Active Interventions: Assess patient/caregiver ability to perform ulcer/skin care regimen upon admission and as needed Assess ulceration(s) every visit Provide education on ulcer and skin care Treatment Activities: Skin care regimen initiated : 08/21/2022 Topical wound management initiated : 08/21/2022 Notes: Misty Vega, Misty Vega ([id number]) [phone].pdf Page 5 of 8 Electronic  Signature(s) Signed: 08/22/2022 4:51:53 PM By: Shawn Stall RN, BSN Entered By: Shawn Stall on 08/21/2022 10:49:58 -------------------------------------------------------------------------------- Pain Assessment Details Patient Name: Date of Service: Misty Rua. 08/21/2022 10:00 A M Medical Record Number: [id number] Patient Account Number: [id number] Date of Birth/Sex: Treating RN: [birth year] (72 y.o. Arta Silence Primary Care Kenndra Morris: Mort Sawyers Other Clinician: Referring Charnita Trudel: Treating Jeet Shough/Extender: Daisey Must Weeks in Treatment: 0 Active Problems Location of Pain Severity and Description of Pain Patient Has Paino No Site Locations Rate the pain. Current Pain Level: 0 Character of Pain Describe the Pain: Burning, Sharp, Shooting Pain Management and Medication Current Pain Management: Medication: No Cold Application: No Rest: No Massage: No Activity: No T.E.N.S.: No Heat Application: No Leg drop or elevation: No Is the Current Pain Management Adequate: Adequate How does your wound impact your activities of daily livingo Sleep: No Bathing: No Appetite: No Relationship With Others: No Bladder Continence: No Emotions: No Bowel Continence: No Work: No Toileting: No Drive: No Dressing: No Hobbies: No Notes per patient times pain, currently no pain. Electronic Signature(s) Signed: 08/22/2022 4:51:53 PM By: Shawn Stall RN, BSN Entered By: Shawn Stall on 08/21/2022 10:29:44 -------------------------------------------------------------------------------- Patient/Caregiver Education Details Patient Name: Date of Service: Misty Vega, Misty J. 4/24/2024andnbsp10:00 A M Medical Record Number: [id number] Patient Account Number: [id number] Misty Vega, Misty Vega ([id number]) [phone].pdf Page 6 of 8 Date of Birth/Gender: Treating RN: Jul 08, 1950 (72 y.o. Arta Silence Primary Care Physician: Mort Sawyers  Other Clinician: Referring Physician: Treating Physician/Extender: Alonza Smoker in Treatment: 0 Education Assessment Education Provided To: Patient Education Topics Provided Welcome T [facility]New [facility] Packet: o Handouts: [facility]aling Pledge form, Welcome T [facility] o Methods: Explain/Verbal Responses: Reinforcements needed Electronic Signature(s) Signed: 08/22/2022 4:51:53 PM By: Shawn Stall RN, BSN Entered By: Shawn Stall on 08/21/2022 10:56:00 -------------------------------------------------------------------------------- Wound Assessment Details Patient Name: Date of Service: Misty Rua. 08/21/2022 10:00 A M Medical Record Number: [id number] Patient Account Number: [id number] Date of Birth/Sex: Treating RN: 10-03-1950 (72 y.o. [name], Yvonne Kendall Primary Care Elige Shouse: Mort Sawyers Other Clinician: Referring Charlea Nardo: Treating Gram Siedlecki/Extender: Suella Grove, Tabitha Weeks in Treatment: 0 Wound Status Wound Number: 1 Primary Etiology: Abrasion Wound Location: Left, Anterior Lower Leg Wound Status: Open Wounding Event: Trauma Date Acquired: 07/29/2022 Weeks Of Treatment: 0 Clustered Wound: Yes Photos Wound Measurements Length: (cm) Width: (cm) Depth: (cm) Clustered Quantity: Area: (cm) Volume: (cm) 1.8 % Reduction in Area: 1.5 % Reduction in Volume: 0.1 Epithelialization: Small (1-33%) 2 Tunneling: No 2.121 Undermining: No 0.212 Wound Description Classification: Unclassifiable Wound Margin: Distinct, outline attached Exudate Amount: Medium Exudate Type: Serosanguineous Exudate Color: red, brown Foul Odor After Cleansing: No Slough/Fibrino Yes Wound Bed CYENNA, REBELLO ([id number]) [id number] Page 7 of 8 Granulation Amount: None Present (0%) Exposed Structure Necrotic Amount: Large (67-100%) Fascia Exposed: No Necrotic Quality: Eschar, Adherent  Slough Fat Layer (Subcutaneous Tissue)  Exposed: Yes Tendon Exposed: No Muscle Exposed: No Joint Exposed: No Bone Exposed: No Periwound Skin Texture Texture Color No Abnormalities Noted: No No Abnormalities Noted: No Callus: No Atrophie Blanche: No Crepitus: No Cyanosis: No Excoriation: No Ecchymosis: No Induration: No Erythema: Yes Rash: No Erythema Location: Circumferential Scarring: No Hemosiderin Staining: No Mottled: No Moisture Pallor: No No Abnormalities Noted: No Rubor: No Dry / Scaly: No Maceration: No Temperature / Pain Temperature: No Abnormality Tenderness on Palpation: Yes Treatment Notes Wound #1 (Lower Leg) Wound Laterality: Left, Anterior Cleanser Soap and Water Discharge Instruction: May shower and wash wound with dial antibacterial soap and water prior to dressing change. [facility] Ancillary Kit - 15 Day Supply Discharge Instruction: Use supplies as instructed; Kit contains: (15) Saline Bullets; (15) 3x3 Gauze; 15 pr Gloves Peri-Wound Care Skin Prep Discharge Instruction: Use skin prep as directed Triamcinolone 15 (g) Discharge Instruction: Use triamcinolone apply around the wound. Topical Primary Dressing Xeroform Occlusive Gauze Dressing, 4x4 in Discharge Instruction: Apply to wound bed as instructed Secondary Dressing Zetuvit Plus Silicone Border Dressing 4x4 (in/in) Discharge Instruction: Apply silicone border over primary dressing as directed. Secured With Compression Wrap Compression Stockings Facilities manager) Signed: 08/22/2022 4:51:53 PM By: Shawn Stall RN, BSN Entered By: Shawn Stall on 08/21/2022 10:39:59 -------------------------------------------------------------------------------- Vitals Details Patient Name: Date of Service: Dierdre Harness, Stanton Kidney J. 08/21/2022 10:00 A M Medical Record Number: [id number] Patient Account Number: [id number] Date of Birth/Sex: Treating RN: [birth year] (72 y.o. Arta Silence Primary  Care Master Touchet: Mort Sawyers Other Clinician: Referring Raeanna Soberanes: Treating Suleman Gunning/Extender: Suella Grove, Tabitha Weeks in Treatment: 0 Vital Signs ASHIRA, KIRSTEN ([id number]) [id number] Page 8 of 8 Time Taken: 10:27 Temperature (F): 97.9 Height (in): 65 Pulse (bpm): 69 Source: Stated Respiratory Rate (breaths/min): 16 Weight (lbs): 164 Blood Pressure (mmHg): 162/91 Source: Stated Reference Range: 80 - 120 mg / dl Body Mass Index (BMI): 27.3 Electronic Signature(s) Signed: 08/22/2022 4:51:53 PM By: Shawn Stall RN, BSN Entered By: Shawn Stall on 08/21/2022 10:29:13

## 2022-08-23 NOTE — Progress Notes (Signed)
AVAH, BASHOR ([id number]) [phone] [id number].pdf Page 1 of 4 Visit Report for 08/21/2022 Abuse Risk Screen Details Patient Name: Date of Service: Misty Vega, Misty Vega 08/21/2022 10:00 A M Medical Record Number: [id number] Patient Account Number: [id number] Date of Birth/Sex: Treating RN: 08-Oct-1950 (72 y.o. Arta Silence Primary Care Whisper Kurka: Mort Sawyers Other Clinician: Referring Calliope Delangel: Treating Edra Riccardi/Extender: Suella Grove, Tabitha Weeks in Treatment: 0 Abuse Risk Screen Items Answer ABUSE RISK SCREEN: Has anyone close to you tried to hurt or harm you recentlyo No Do you feel uncomfortable with anyone in your familyo No Has anyone forced you do things that you didnt want to doo No Electronic Signature(s) Signed: 08/22/2022 4:51:53 PM By: Shawn Stall RN, BSN Entered By: Shawn Stall on 08/21/2022 10:28:00 -------------------------------------------------------------------------------- Activities of Daily Living Details Patient Name: Date of Service: Misty Vega, Misty Vega 08/21/2022 10:00 A M Medical Record Number: [id number] Patient Account Number: [id number] Date of Birth/Sex: Treating RN: 02/25/1951 (72 y.o. Arta Silence Primary Care Brandin Dilday: Mort Sawyers Other Clinician: Referring Shantrell Placzek: Treating Eller Sweis/Extender: Suella Grove, Tabitha Weeks in Treatment: 0 Activities of Daily Living Items Answer Activities of Daily Living (Please select one for each item) Drive Automobile Completely Able T Medications ake Completely Able Use T elephone Completely Able Care for Appearance Completely Able Use T oilet Completely Able Bath / Shower Completely Able Dress Self Completely Able Feed Self Completely Able Walk Completely Able Get In / Out Bed Completely Able Housework Completely Able Prepare Meals Completely Able Handle Money Completely Able Shop for Self Completely Able Electronic Signature(s) Signed:  08/22/2022 4:51:53 PM By: Shawn Stall RN, BSN Entered By: Shawn Stall on 08/21/2022 10:28:22 -------------------------------------------------------------------------------- Education Screening Details Patient Name: Date of Service: Misty Rua. 08/21/2022 10:00 A M Medical Record Number: [id number] Patient Account Number: [id number] Date of Birth/Sex: Treating RN: Jul 19, 1950 (72 y.o. Arta Silence Primary Care Ratasha Fabre: Mort Sawyers Other Clinician: Referring Ashling Roane: Treating Charelle Petrakis/Extender: Suella Grove, Tabitha Weeks in Treatment: [address] PARRIE, RASCO ([id number]) [id number] Page 2 of 4 Primary Learner Assessed: Patient Learning Preferences/Education Level/Primary Language Learning Preference: Explanation, Demonstration, Printed Material Highest Education Level: High School Preferred Language: Economist Language Barrier: No Translator Needed: No Memory Deficit: No Emotional Barrier: No Cultural/Religious Beliefs Affecting Medical Care: No Physical Barrier Impaired Vision: No Impaired Hearing: No Decreased Hand dexterity: No Knowledge/Comprehension Knowledge Level: High Comprehension Level: High Ability to understand written instructions: High Ability to understand verbal instructions: High Motivation Anxiety Level: Calm Cooperation: Cooperative Education Importance: Acknowledges Need Interest in Health Problems: Asks Questions Perception: Coherent Willingness to Engage in Self-Management High Activities: Readiness to Engage in Self-Management High Activities: Electronic Signature(s) Signed: 08/22/2022 4:51:53 PM By: Shawn Stall RN, BSN Entered By: Shawn Stall on 08/21/2022 10:28:40 -------------------------------------------------------------------------------- Fall Risk Assessment Details Patient Name: Date of Service: Misty Calamity J. 08/21/2022 10:00 A M Medical Record Number:  [id number] Patient Account Number: [id number] Date of Birth/Sex: Treating RN: 12-May-1950 (72 y.o. [name], Yvonne Kendall Primary Care Doralee Kocak: Mort Sawyers Other Clinician: Referring Chrishauna Mee: Treating Kohen Reither/Extender: Suella Grove, Tabitha Weeks in Treatment: 0 Fall Risk Assessment Items Have you had 2 or more falls in the last 12 monthso 0 No Have you had any fall that resulted in injury in the last 12 monthso 0 No FALLS RISK SCREEN History of falling - immediate or within 3 months 0 No Secondary diagnosis (Do you have 2 or more medical diagnoseso) 0 No Ambulatory aid None/bed rest/wheelchair/nurse 0  Yes Crutches/cane/walker 0 No Furniture 0 No Intravenous therapy Access/Saline/Heparin Lock 0 No Gait/Transferring Normal/ bed rest/ wheelchair 0 Yes Weak (short steps with or without shuffle, stooped but able to lift head while walking, may seek 0 No support from furniture) Impaired (short steps with shuffle, may have difficulty arising from chair, head down, impaired 0 No balance) Mental Status Oriented to own ability 0 Yes Overestimates or forgets limitations 0 No Risk Level: Low Risk Score: 0 Misty Vega, Misty Vega ([id number]) [phone] [id number].pdf Page 3 of 4 Electronic Signature(s) -------------------------------------------------------------------------------- Foot Assessment Details Patient Name: Date of Service: Misty Vega, Misty Vega 08/21/2022 10:00 A M Medical Record Number: [id number] Patient Account Number: [id number] Date of Birth/Sex: Treating RN: October 07, 1950 (72 y.o. Arta Silence Primary Care Chantee Cerino: Mort Sawyers Other Clinician: Referring Jakaiden Fill: Treating Madilynn Montante/Extender: Suella Grove, Tabitha Weeks in Treatment: 0 Foot Assessment Items Site Locations + = Sensation present, - = Sensation absent, C = Callus, U = Ulcer R = Redness, W = Warmth, M = Maceration, PU = Pre-ulcerative lesion F = Fissure, S = Swelling, D =  Dryness Assessment Right: Left: Other Deformity: No No Prior Foot Ulcer: No No Prior Amputation: No No Charcot Joint: No No Ambulatory Status: Ambulatory Without Help Gait: Steady Electronic Signature(s) Signed: 08/22/2022 4:51:53 PM By: Shawn Stall RN, BSN Entered By: Shawn Stall on 08/21/2022 10:34:12 -------------------------------------------------------------------------------- Nutrition Risk Screening Details Patient Name: Date of Service: Misty Rua 08/21/2022 10:00 A M Medical Record Number: [id number] Patient Account Number: [id number] Date of Birth/Sex: Treating RN: 03/03/1951 (72 y.o. Arta Silence Primary Care Anmol Fleck: Mort Sawyers Other Clinician: Referring Kaidence Sant: Treating Jazzalynn Rhudy/Extender: Suella Grove, Tabitha Weeks in Treatment: 0 Height (in): Weight (lbs): Body Mass Index (BMI): Misty Vega, Misty Vega ([id number]) [phone] [id number] Page 4 of 4 Nutrition Risk Screening Items Score Screening NUTRITION RISK SCREEN: I have an illness or condition that made me change the kind and/or amount of food I eat 0 No I eat fewer than two meals per day 0 No I eat few fruits and vegetables, or milk products 0 No I have three or more drinks of beer, liquor or wine almost every day 0 No I have tooth or mouth problems that make it hard for me to eat 0 No I don't always have enough money to buy the food I need 0 No I eat alone most of the time 0 No I take three or more different prescribed or over-the-counter drugs a day 0 No Without wanting to, I have lost or gained 10 pounds in the last six months 0 No I am not always physically able to shop, cook and/or feed myself 0 No Nutrition Protocols Good Risk Protocol Moderate Risk Protocol 0 Provide education on nutrition High Risk Proctocol Risk Level: Good Risk Score: 0 Electronic Signature(s) Signed: 08/22/2022 4:51:53 PM By: Shawn Stall RN, BSN Entered By: Shawn Stall on  08/21/2022 10:29:02

## 2022-08-28 ENCOUNTER — Encounter (HOSPITAL_BASED_OUTPATIENT_CLINIC_OR_DEPARTMENT_OTHER): Payer: Medicare HMO | Attending: Physician Assistant | Admitting: Physician Assistant

## 2022-08-28 DIAGNOSIS — Y93H9 Activity, other involving exterior property and land maintenance, building and construction: Secondary | ICD-10-CM | POA: Diagnosis not present

## 2022-08-28 DIAGNOSIS — S8012XA Contusion of left lower leg, initial encounter: Secondary | ICD-10-CM | POA: Insufficient documentation

## 2022-08-28 DIAGNOSIS — L97822 Non-pressure chronic ulcer of other part of left lower leg with fat layer exposed: Secondary | ICD-10-CM | POA: Insufficient documentation

## 2022-08-28 DIAGNOSIS — W228XXA Striking against or struck by other objects, initial encounter: Secondary | ICD-10-CM | POA: Diagnosis not present

## 2022-08-28 DIAGNOSIS — S81812A Laceration without foreign body, left lower leg, initial encounter: Secondary | ICD-10-CM | POA: Diagnosis not present

## 2022-08-29 NOTE — Progress Notes (Addendum)
Misty Vega, Misty Vega ([id number]) [id number] Page 1 of 5 Visit Report for 08/28/2022 Chief Complaint Document Details Patient Name: Date of Service: Misty Vega 08/28/2022 2:15 PM Medical Record Number: [id number] Patient Account Number: [id number] Date of Birth/Sex: Treating RN: 22-Apr-1951 (72 y.o. F) Primary Care Provider: Mort Sawyers Other Clinician: Referring Provider: Treating Provider/Extender: Suella Grove, Tabitha Weeks in Treatment: 1 Information Obtained from: Patient Chief Complaint Left leg ulcer Electronic Signature(s) Signed: 08/28/2022 2:44:59 PM By: Allen Derry PA-C Entered By: Allen Derry on 08/28/2022 14:44:59 -------------------------------------------------------------------------------- HPI Details Patient Name: Date of Service: Misty Vega. 08/28/2022 2:15 PM Medical Record Number: [id number] Patient Account Number: [id number] Date of Birth/Sex: Treating RN: 1950/12/08 (72 y.o. F) Primary Care Provider: Mort Sawyers Other Clinician: Referring Provider: Treating Provider/Extender: Suella Grove, Tabitha Weeks in Treatment: 1 History of Present Illness HPI Description: 08-21-2022 upon evaluation today patient presents for initial inspection here in our clinic concerning issue that occurred on April 1 when she was outside chopping monkey grass with an ax. She tells me that she went to swing the ax and inadvertently hit herself in the anterior portion of the shin. The handle is what hit her thank goodness not the ax head. Nonetheless she has been having issues try to get this healed since that time has now been about 3 weeks just a little bit over. She initially was on antibiotics she is not sure the name however this had to be changed after the culture showed that she had Pseudomonas present she was switched to Levaquin and she has been taking that since Friday which is about 4 to 5 days ago. She does have a  current regimen of use an antibiotic ointment after cleaning with soap and water. She uses a hypoallergenic soap. Fortunately I do not see any signs of active infection locally or systemically which is great news. She does have some irritation around the edges of the wound I think this may be actually allergic reaction to the neomycin and the Neosporin as everything else she is using is really hypoallergenic. The patient does have a history of minimal swelling of her legs but otherwise no major medical problems. 08-28-2022 upon evaluation today patient's wound is actually showing signs of significant improvement. This is pretty much about a night and day difference compared to last week this week. I am actually very pleased with where we stand I think that she is doing quite well. I do not see any signs of irritation and the wound looks much cleaner I think we are at a point where Xeroform probably is not even necessary I think a little bit of collagen would be perfect at this point. Electronic Signature(s) Signed: 08/28/2022 2:58:26 PM By: Allen Derry PA-C Entered By: Allen Derry on 08/28/2022 14:58:26 -------------------------------------------------------------------------------- Physical Exam Details Patient Name: Date of Service: Misty Vega, Misty Vega 08/28/2022 2:15 PM Medical Record Number: [id number] Patient Account Number: [id number] Date of Birth/Sex: Treating RN: 21-Nov-1950 (72 y.o. F) Primary Care Provider: Mort Sawyers Other Clinician: Referring Provider: Treating Provider/Extender: Suella Grove, Tabitha Weeks in Treatment: 1 Constitutional Well-nourished and well-hydrated in no acute distress. Respiratory Misty Vega ([id number]) [id number] Page 2 of 5 normal breathing without difficulty. Psychiatric this patient is able to make decisions and demonstrates good insight into disease process. Alert and Oriented x 3. pleasant and  cooperative. Notes Upon inspection patient's wound bed appears to be doing excellent I think she is making great progress I think  she is ready for switching to collagen. Electronic Signature(s) Signed: 08/28/2022 3:19:21 PM By: Allen Derry PA-C Entered By: Allen Derry on 08/28/2022 15:19:21 -------------------------------------------------------------------------------- Physician Orders Details Patient Name: Date of Service: Misty Vega 08/28/2022 2:15 PM Medical Record Number: [id number] Patient Account Number: [id number] Date of Birth/Sex: Treating RN: 08/06/1950 (72 y.o. [name], [name] Primary Care Provider: Mort Sawyers Other Clinician: Referring Provider: Treating Provider/Extender: Suella Grove, Tabitha Weeks in Treatment: 1 Verbal / Phone Orders: No Diagnosis Coding ICD-10 Coding Code Description [id number] Laceration without foreign body, left lower leg, initial encounter L97.822 Non-pressure chronic ulcer of other part of left lower leg with fat layer exposed S80.12XA Contusion of left lower leg, initial encounter Follow-up Appointments ppointment in 1 week. Allen Derry PA, Wednesday 09/04/2022 0845 room 8 Return A ppointment in 2 weeks. Allen Derry PA, Wednesday 09/11/2022 0930 room 8 Return A Other: - [facility]- wound care supplier. TCA cream applied to around the wound bed-pick up from pharmacy. Anesthetic (In clinic) Topical Lidocaine 5% applied to wound bed Bathing/ Shower/ Hygiene May shower and wash wound with soap and water. Edema Control - Lymphedema / SCD / Other Elevate legs to the level of the heart or above for 30 minutes daily and/or when sitting for 3-4 times a day throughout the day. Avoid standing for long periods of time. Exercise regularly Moisturize legs daily. - every night before bed. Wound Treatment Wound #1 - Lower Leg Wound Laterality: Left, Anterior Cleanser: Soap and Water Every Other Day/30 Days Discharge Instructions: May shower  and wash wound with dial antibacterial soap and water prior to dressing change. Cleanser: [facility] Ancillary Kit - 15 Day Supply (Generic) Every Other Day/30 Days Discharge Instructions: Use supplies as instructed; Kit contains: (15) Saline Bullets; (15) 3x3 Gauze; 15 pr Gloves Peri-Wound Care: Skin Prep (Generic) Every Other Day/30 Days Discharge Instructions: Use skin prep as directed Prim Dressing: Fibracol Plus Dressing, 4x4.38 in (collagen) Every Other Day/30 Days ary Discharge Instructions: Moisten collagen with saline or hydrogel Secondary Dressing: Zetuvit Plus Silicone Border Dressing 4x4 (in/in) (Generic) Every Other Day/30 Days Discharge Instructions: Apply silicone border over primary dressing as directed. Electronic Signature(s) Signed: 08/28/2022 4:44:49 PM By: Shawn Stall RN, BSN Signed: 08/28/2022 5:01:35 PM By: Allen Derry PA-C Entered By: Shawn Stall on 08/28/2022 14:50:07 [name] ([id number]) [id number] Page 3 of 5 -------------------------------------------------------------------------------- Problem List Details Patient Name: Date of Service: LASHUNA, TAMASHIRO 08/28/2022 2:15 PM Medical Record Number: [id number] Patient Account Number: [id number] Date of Birth/Sex: Treating RN: 07-02-1950 (72 y.o. Arta Silence Primary Care Provider: Mort Sawyers Other Clinician: Referring Provider: Treating Provider/Extender: Suella Grove, Tabitha Weeks in Treatment: 1 Active Problems ICD-10 Encounter Code Description Active Date MDM Diagnosis S81.812A Laceration without foreign body, left lower leg, initial encounter 08/21/2022 No Yes L97.822 Non-pressure chronic ulcer of other part of left lower leg with fat layer exposed4/24/2024 No Yes S80.12XA Contusion of left lower leg, initial encounter 08/21/2022 No Yes Inactive Problems Resolved Problems Electronic Signature(s) Signed: 08/28/2022 2:44:51 PM By: Allen Derry PA-C Entered  By: Allen Derry on 08/28/2022 14:44:51 -------------------------------------------------------------------------------- Progress Note Details Patient Name: Date of Service: Misty Vega. 08/28/2022 2:15 PM Medical Record Number: [id number] Patient Account Number: [id number] Date of Birth/Sex: Treating RN: March 12, 1951 (72 y.o. F) Primary Care Provider: Mort Sawyers Other Clinician: Referring Provider: Treating Provider/Extender: Suella Grove, Tabitha Weeks in Treatment: 1 Subjective Chief Complaint Information obtained from Patient Left leg ulcer History of  Present Illness (HPI) 08-21-2022 upon evaluation today patient presents for initial inspection here in our clinic concerning issue that occurred on April 1 when she was outside chopping monkey grass with an ax. She tells me that she went to swing the ax and inadvertently hit herself in the anterior portion of the shin. The handle is what hit her thank goodness not the ax head. Nonetheless she has been having issues try to get this healed since that time has now been about 3 weeks just a little bit over. She initially was on antibiotics she is not sure the name however this had to be changed after the culture showed that she had Pseudomonas present she was switched to Levaquin and she has been taking that since Friday which is about 4 to 5 days ago. She does have a current regimen of use an antibiotic ointment after cleaning with soap and water. She uses a hypoallergenic soap. Fortunately I do not see any signs of active infection locally or systemically which is great news. She does have some irritation around the edges of the wound I think this may be actually allergic reaction to the neomycin and the Neosporin as everything else she is using is really hypoallergenic. The patient does have a history of minimal swelling of her legs but otherwise no major medical problems. 08-28-2022 upon evaluation today patient's wound is  actually showing signs of significant improvement. This is pretty much about a night and day difference compared to last week this week. I am actually very pleased with where we stand I think that she is doing quite well. I do not see any signs of irritation and the wound looks much cleaner I think we are at a point where Xeroform probably is not even necessary I think a little bit of collagen would be perfect at this point. KALIANA, Misty Vega ([id number]) [id number] Page 4 of 5 Objective Constitutional Well-nourished and well-hydrated in no acute distress. Vitals Time Taken: 2:33 PM, Height: 65 in, Weight: 164 lbs, BMI: 27.3, Temperature: 97.9 F, Pulse: 74 bpm, Respiratory Rate: 20 breaths/min, Blood Pressure: 161/85 mmHg. Respiratory normal breathing without difficulty. Psychiatric this patient is able to make decisions and demonstrates good insight into disease process. Alert and Oriented x 3. pleasant and cooperative. General Notes: Upon inspection patient's wound bed appears to be doing excellent I think she is making great progress I think she is ready for switching to collagen. Integumentary (Hair, Skin) Wound #1 status is Open. Original cause of wound was Trauma. The date acquired was: 07/29/2022. The wound has been in treatment 1 weeks. The wound is located on the Left,Anterior Lower Leg. The wound measures 1.3cm length x 0.3cm width x 0.2cm depth; 0.306cm^2 area and 0.061cm^3 volume. There is Fat Layer (Subcutaneous Tissue) exposed. There is no tunneling or undermining noted. There is a medium amount of serosanguineous drainage noted. The wound margin is distinct with the outline attached to the wound base. There is large (67-100%) red granulation within the wound bed. There is a small (1-33%) amount of necrotic tissue within the wound bed including Adherent Slough. The periwound skin appearance did not exhibit: Callus, Crepitus, Excoriation, Induration,  Rash, Scarring, Dry/Scaly, Maceration, Atrophie Blanche, Cyanosis, Ecchymosis, Hemosiderin Staining, Mottled, Pallor, Rubor, Erythema. Periwound temperature was noted as No Abnormality. The periwound has tenderness on palpation. Assessment Active Problems ICD-10 Laceration without foreign body, left lower leg, initial encounter Non-pressure chronic ulcer of other part of left lower leg with fat layer exposed Contusion of  left lower leg, initial encounter Plan Follow-up Appointments: Return Appointment in 1 week. Allen Derry PA, Wednesday 09/04/2022 0845 room 8 Return Appointment in 2 weeks. Allen Derry PA, Wednesday 09/11/2022 0930 room 8 Other: - [facility]- wound care supplier. TCA cream applied to around the wound bed-pick up from pharmacy. Anesthetic: (In clinic) Topical Lidocaine 5% applied to wound bed Bathing/ Shower/ Hygiene: May shower and wash wound with soap and water. Edema Control - Lymphedema / SCD / Other: Elevate legs to the level of the heart or above for 30 minutes daily and/or when sitting for 3-4 times a day throughout the day. Avoid standing for long periods of time. Exercise regularly Moisturize legs daily. - every night before bed. WOUND #1: - Lower Leg Wound Laterality: Left, Anterior Cleanser: Soap and Water Every Other Day/30 Days Discharge Instructions: May shower and wash wound with dial antibacterial soap and water prior to dressing change. Cleanser: [facility] Ancillary Kit - 15 Day Supply (Generic) Every Other Day/30 Days Discharge Instructions: Use supplies as instructed; Kit contains: (15) Saline Bullets; (15) 3x3 Gauze; 15 pr Gloves Peri-Wound Care: Skin Prep (Generic) Every Other Day/30 Days Discharge Instructions: Use skin prep as directed Prim Dressing: Fibracol Plus Dressing, 4x4.38 in (collagen) Every Other Day/30 Days ary Discharge Instructions: Moisten collagen with saline or hydrogel Secondary Dressing: Zetuvit Plus Silicone Border Dressing 4x4 (in/in)  (Generic) Every Other Day/30 Days Discharge Instructions: Apply silicone border over primary dressing as directed. 1. I would recommend currently based on what we are seeing that we have the patient going continue to monitor for any signs of infection or worsening. Based on what I am seeing I do believe that she is actually doing quite well and I am extremely pleased in this regard. I do not see any signs of infection locally nor systemically which is great news. 2. I am good recommend as well that we have the patient initiate treatment with a collagen dressing I think this should help to get this healed much more effectively and quickly she is in agreement with that plan and we will subsequently see where things stand at follow-up. We will see patient back for reevaluation in 1 week here in the clinic. If anything worsens or changes patient will contact our office for additional recommendations. Misty Vega, Misty Vega ([id number]) [id number] Page 5 of 5 Electronic Signature(s) Signed: 08/28/2022 3:20:01 PM By: Allen Derry PA-C Entered By: Allen Derry on 08/28/2022 15:20:01 -------------------------------------------------------------------------------- SuperBill Details Patient Name: Date of Service: Misty Vega 08/28/2022 Medical Record Number: [id number] Patient Account Number: [id number] Date of Birth/Sex: Treating RN: Jun 07, 1950 (72 y.o. Arta Silence Primary Care Provider: Mort Sawyers Other Clinician: Referring Provider: Treating Provider/Extender: Suella Grove, Tabitha Weeks in Treatment: 1 Diagnosis Coding ICD-10 Codes Code Description [phone] Laceration without foreign body, left lower leg, initial encounter L97.822 Non-pressure chronic ulcer of other part of left lower leg with fat layer exposed S80.12XA Contusion of left lower leg, initial encounter Facility Procedures : CPT4 Code: [id number] Description: 99213 - WOUND CARE VISIT-LEV  3 EST PT Modifier: Quantity: 1 Physician Procedures : CPT4 Code Description Modifier [id number] 99213 - WC PHYS LEVEL 3 - EST PT ICD-10 Diagnosis Description S81.812A Laceration without foreign body, left lower leg, initial encounter L97.822 Non-pressure chronic ulcer of other part of left lower leg with fat  layer exposed S80.12XA Contusion of left lower leg, initial encounter Quantity: 1 Electronic Signature(s) Signed: 08/28/2022 3:20:22 PM By: Allen Derry PA-C Entered By: Allen Derry on  08/28/2022 15:20:22 

## 2022-08-29 NOTE — Progress Notes (Signed)
Misty Vega, Misty Vega ([id number]) [id number] Page 1 of 7 Visit Report for 08/28/2022 Arrival Information Details Patient Name: Date of Service: Misty Vega, Misty Vega 08/28/2022 2:15 PM Medical Record Number: [id number] Patient Account Number: [id number] Date of Birth/Sex: Treating RN: 1950/12/22 (72 y.o. Misty Vega, Misty Vega Primary Care Misty Vega: Misty Vega Other Clinician: Referring Teddie Curd: Treating Ronnika Collett/Extender: Daisey Must Vega in Treatment: 1 Visit Information History Since Last Visit Added or deleted any medications: No Patient Arrived: Ambulatory Any new allergies or adverse reactions: No Arrival Time: 14:33 Had a fall or experienced change in No Accompanied By: self activities of daily living that may affect Transfer Assistance: None risk of falls: Patient Identification Verified: Yes Signs or symptoms of abuse/neglect since last visito No Secondary Verification Process Completed: Yes Hospitalized since last visit: No Patient Requires Transmission-Based Precautions: No Implantable device outside of the clinic excluding No Patient Has Alerts: No cellular tissue based products placed in the center since last visit: Has Dressing in Place as Prescribed: Yes Pain Present Now: No Electronic Signature(s) Signed: 08/28/2022 4:44:49 PM By: Shawn Stall RN, BSN Entered By: Shawn Stall on 08/28/2022 14:33:26 -------------------------------------------------------------------------------- Clinic Level of Care Assessment Details Patient Name: Date of Service: KIASIA, CHOU 08/28/2022 2:15 PM Medical Record Number: [id number] Patient Account Number: [id number] Date of Birth/Sex: Treating RN: 12-31-1950 (72 y.o. Misty Vega, Misty Vega Primary Care Adreanna Fickel: Misty Vega Other Clinician: Referring Horace Wishon: Treating Lorenzo Pereyra/Extender: Daisey Must Vega in Treatment: 1 Clinic Level of Care Assessment Items TOOL 4  Quantity Score X- 1 0 Use when only an EandM is performed on FOLLOW-UP visit ASSESSMENTS - Nursing Assessment / Reassessment X- 1 10 Reassessment of Co-morbidities (includes updates in patient status) X- 1 5 Reassessment of Adherence to Treatment Plan ASSESSMENTS - Wound and Skin A ssessment / Reassessment X - Simple Wound Assessment / Reassessment - one wound 1 5 []  - 0 Complex Wound Assessment / Reassessment - multiple wounds X- 1 10 Dermatologic / Skin Assessment (not related to wound area) ASSESSMENTS - Focused Assessment X- 1 5 Circumferential Edema Measurements - multi extremities []  - 0 Nutritional Assessment / Counseling / Intervention []  - 0 Lower Extremity Assessment (monofilament, tuning fork, pulses) []  - 0 Peripheral Arterial Disease Assessment (using hand held doppler) ASSESSMENTS - Ostomy and/or Continence Assessment and Care []  - 0 Incontinence Assessment and Management []  - 0 Ostomy Care Assessment and Management (repouching, etc.) PROCESS - Coordination of Care X - Simple Patient / Family Education for ongoing care 1 15 NELA, BASCOM ([id number]) [id number] Page 2 of 7 []  - 0 Complex (extensive) Patient / Family Education for ongoing care X- 1 10 Staff obtains Chiropractor, Records, T Results / Process Orders est []  - 0 Staff telephones HHA, Nursing Homes / Clarify orders / etc []  - 0 Routine Transfer to another Facility (non-emergent condition) []  - 0 Routine Hospital Admission (non-emergent condition) []  - 0 New Admissions / Manufacturing engineer / Ordering NPWT Apligraf, etc. , []  - 0 Emergency Hospital Admission (emergent condition) X- 1 10 Simple Discharge Coordination []  - 0 Complex (extensive) Discharge Coordination PROCESS - Special Needs []  - 0 Pediatric / Minor Patient Management []  - 0 Isolation Patient Management []  - 0 Hearing / Language / Visual special needs []  - 0 Assessment of Community  assistance (transportation, D/C planning, etc.) []  - 0 Additional assistance / Altered mentation []  - 0 Support Surface(s) Assessment (bed, cushion, seat, etc.) INTERVENTIONS - Wound Cleansing / Measurement X - Simple  Wound Cleansing - one wound 1 5 []  - 0 Complex Wound Cleansing - multiple wounds X- 1 5 Wound Imaging (photographs - any number of wounds) []  - 0 Wound Tracing (instead of photographs) X- 1 5 Simple Wound Measurement - one wound []  - 0 Complex Wound Measurement - multiple wounds INTERVENTIONS - Wound Dressings X - Small Wound Dressing one or multiple wounds 1 10 []  - 0 Medium Wound Dressing one or multiple wounds []  - 0 Large Wound Dressing one or multiple wounds []  - 0 Application of Medications - topical []  - 0 Application of Medications - injection INTERVENTIONS - Miscellaneous []  - 0 External ear exam []  - 0 Specimen Collection (cultures, biopsies, blood, body fluids, etc.) []  - 0 Specimen(s) / Culture(s) sent or taken to Lab for analysis []  - 0 Patient Transfer (multiple staff / Nurse, adult / Similar devices) []  - 0 Simple Staple / Suture removal (25 or less) []  - 0 Complex Staple / Suture removal (26 or more) []  - 0 Hypo / Hyperglycemic Management (close monitor of Blood Glucose) []  - 0 Ankle / Brachial Index (ABI) - do not check if billed separately X- 1 5 Vital Signs Has the patient been seen at the hospital within the last three years: Yes Total Score: 100 Level Of Care: New/Established - Level 3 Electronic Signature(s) Signed: 08/28/2022 4:44:49 PM By: Shawn Stall RN, BSN Entered By: Shawn Stall on 08/28/2022 14:48:26 Misty Vega ([id number]) [id number] Page 3 of 7 -------------------------------------------------------------------------------- Encounter Discharge Information Details Patient Name: Date of Service: Misty Vega, Misty Vega 08/28/2022 2:15 PM Medical Record Number: [id number] Patient Account  Number: [id number] Date of Birth/Sex: Treating RN: 1951/04/16 (72 y.o. Misty Vega Primary Care Dayna Geurts: Misty Vega Other Clinician: Referring Delane Wessinger: Treating Alvester Eads/Extender: Misty Vega, Misty Vega in Treatment: 1 Encounter Discharge Information Items Discharge Condition: Stable Ambulatory Status: Ambulatory Discharge Destination: Home Transportation: Private Auto Accompanied By: self Schedule Follow-up Appointment: Yes Clinical Summary of Care: Electronic Signature(s) Signed: 08/28/2022 4:44:49 PM By: Shawn Stall RN, BSN Entered By: Shawn Stall on 08/28/2022 14:48:52 -------------------------------------------------------------------------------- Lower Extremity Assessment Details Patient Name: Date of Service: Misty Vega. 08/28/2022 2:15 PM Medical Record Number: [id number] Patient Account Number: [id number] Date of Birth/Sex: Treating RN: 09-25-1950 (72 y.o. Misty Vega Primary Care Cleota Pellerito: Misty Vega Other Clinician: Referring Elise Knobloch: Treating Aika Brzoska/Extender: Misty Vega, Misty Vega in Treatment: 1 Edema Assessment Assessed: [Left: Yes] [Right: No] Edema: [Left: N] [Right: o] Calf Left: Right: Point of Measurement: 37 cm From Medial Instep 36.5 cm Ankle Left: Right: Point of Measurement: 8 cm From Medial Instep 22 cm Vascular Assessment Pulses: Dorsalis Pedis Palpable: [Left:Yes] Electronic Signature(s) Signed: 08/28/2022 4:44:49 PM By: Shawn Stall RN, BSN Entered By: Shawn Stall on 08/28/2022 14:34:32 -------------------------------------------------------------------------------- Multi-Disciplinary Care Plan Details Patient Name: Date of Service: Misty Vega. 08/28/2022 2:15 PM Medical Record Number: [id number] Patient Account Number: [id number] Date of Birth/Sex: Treating RN: Oct 30, 1950 (72 y.o. Misty Vega Primary Care Jurline Folger: Misty Vega Other Clinician: Referring  Ronan Dion: Treating Chrishawna Farina/Extender: Misty Vega, Misty Vega in Treatment: 1 DIANARA, SMULLEN ([id number]) [id number] Page 4 of 7 Active Inactive Pain, Acute or Chronic Nursing Diagnoses: Pain, acute or chronic: actual or potential Potential alteration in comfort, pain Goals: Patient will verbalize adequate pain control and receive pain control interventions during procedures as needed Date Initiated: 08/21/2022 Target Resolution Date: 09/06/2022 Goal Status: Active Patient/caregiver will verbalize comfort level met Date Initiated: 08/21/2022 Target Resolution Date:  09/05/2022 Goal Status: Active Interventions: Encourage patient to take pain medications as prescribed Provide education on pain management Reposition patient for comfort Treatment Activities: Administer pain control measures as ordered : 08/21/2022 Notes: Soft Tissue Infection Nursing Diagnoses: Knowledge deficit related to home infection control: handwashing, handling of soiled dressings, supply storage Goals: Patient will remain free of wound infection Date Initiated: 08/21/2022 Target Resolution Date: 09/06/2022 Goal Status: Active Signs and symptoms of infection will be recognized early to allow for prompt treatment Date Initiated: 08/21/2022 Target Resolution Date: 09/06/2022 Goal Status: Active Interventions: Assess signs and symptoms of infection every visit Provide education on infection Treatment Activities: Culture : 08/14/2022 Culture and sensitivity : 08/21/2022 Systemic antibiotics : 08/21/2022 Notes: Wound/Skin Impairment Nursing Diagnoses: Knowledge deficit related to ulceration/compromised skin integrity Goals: Patient/caregiver will verbalize understanding of skin care regimen Date Initiated: 08/21/2022 Target Resolution Date: 09/06/2022 Goal Status: Active Interventions: Assess patient/caregiver ability to perform ulcer/skin care regimen upon admission and  as needed Assess ulceration(s) every visit Provide education on ulcer and skin care Treatment Activities: Skin care regimen initiated : 08/21/2022 Topical wound management initiated : 08/21/2022 Notes: Electronic Signature(s) Signed: 08/28/2022 4:44:49 PM By: Shawn Stall RN, BSN Misty Vega ([id number]) [id number] Page 5 of 7 Entered By: Shawn Stall on 08/28/2022 14:37:24 -------------------------------------------------------------------------------- Pain Assessment Details Patient Name: Date of Service: Misty Vega, Misty Vega 08/28/2022 2:15 PM Medical Record Number: [id number] Patient Account Number: [id number] Date of Birth/Sex: Treating RN: August 17, 1950 (72 y.o. Misty Vega Primary Care Berlie Hatchel: Misty Vega Other Clinician: Referring Garrett Mitchum: Treating Martisha Toulouse/Extender: Daisey Must Vega in Treatment: 1 Active Problems Location of Pain Severity and Description of Pain Patient Has Paino No Site Locations Pain Management and Medication Current Pain Management: Electronic Signature(s) Signed: 08/28/2022 4:44:49 PM By: Shawn Stall RN, BSN Entered By: Shawn Stall on 08/28/2022 14:33:56 -------------------------------------------------------------------------------- Patient/Caregiver Education Details Patient Name: Date of Service: Misty Vega 5/1/2024andnbsp2:15 PM Medical Record Number: [id number] Patient Account Number: [id number] Date of Birth/Gender: Treating RN: 1950-06-08 (72 y.o. Misty Vega Primary Care Physician: Misty Vega Other Clinician: Referring Physician: Treating Physician/Extender: Misty Vega in Treatment: 1 Education Assessment Education Provided To: Patient Education Topics Provided Wound/Skin Impairment: Handouts: Caring for Your Ulcer Methods: Explain/Verbal Responses: Reinforcements needed Electronic Signature(s) Signed: 08/28/2022 4:44:49 PM By:  Shawn Stall RN, BSN Entered By: Shawn Stall on 08/28/2022 14:37:32 Misty Vega ([id number]) [id number] Page 6 of 7 -------------------------------------------------------------------------------- Wound Assessment Details Patient Name: Date of Service: Misty Vega, Misty Vega 08/28/2022 2:15 PM Medical Record Number: [id number] Patient Account Number: [id number] Date of Birth/Sex: Treating RN: [birth year] (72 y.o. Misty Vega, Misty Vega Primary Care Olesya Wike: Misty Vega Other Clinician: Referring Rachel Rison: Treating Martrice Apt/Extender: Misty Vega, Misty Vega in Treatment: 1 Wound Status Wound Number: 1 Primary Etiology: Abrasion Wound Location: Left, Anterior Lower Leg Wound Status: Open Wounding Event: Trauma Date Acquired: 07/29/2022 Vega Of Treatment: 1 Clustered Wound: Yes Photos Wound Measurements Length: (cm) Width: (cm) Depth: (cm) Clustered Quantity: Area: (cm) Volume: (cm) 1.3 % Reduction in Area: 85.6% 0.3 % Reduction in Volume: 71.2% 0.2 Epithelialization: Medium (34-66%) 1 Tunneling: No 0.306 Undermining: No 0.061 Wound Description Classification: Full Thickness Without Exposed Sup Wound Margin: Distinct, outline attached Exudate Amount: Medium Exudate Type: Serosanguineous Exudate Color: red, brown port Structures Foul Odor After Cleansing: No Slough/Fibrino Yes Wound Bed Granulation Amount: Large (67-100%) Exposed Structure Granulation Quality: Red Fascia Exposed: No Necrotic Amount: Small (1-33%) Fat Layer (Subcutaneous Tissue) Exposed: Yes Necrotic Quality:  Adherent Slough Tendon Exposed: No Muscle Exposed: No Joint Exposed: No Bone Exposed: No Periwound Skin Texture Texture Color No Abnormalities Noted: No No Abnormalities Noted: No Callus: No Atrophie Blanche: No Crepitus: No Cyanosis: No Excoriation: No Ecchymosis: No Induration: No Erythema: No Rash: No Hemosiderin Staining: No Scarring:  No Mottled: No Pallor: No Moisture Rubor: No No Abnormalities Noted: No Dry / Scaly: No Temperature / Pain Maceration: No Temperature: No Abnormality Tenderness on PalpationALDORA, Misty Vega ([id number]) [id number] Page 7 of 7 Treatment Notes Wound #1 (Lower Leg) Wound Laterality: Left, Anterior Cleanser Soap and Water Discharge Instruction: May shower and wash wound with dial antibacterial soap and water prior to dressing change. [employer] Ancillary Kit - 15 Day Supply Discharge Instruction: Use supplies as instructed; Kit contains: (15) Saline Bullets; (15) 3x3 Gauze; 15 pr Gloves Peri-Wound Care Skin Prep Discharge Instruction: Use skin prep as directed Topical Primary Dressing Fibracol Plus Dressing, 4x4.38 in (collagen) Discharge Instruction: Moisten collagen with saline or hydrogel Secondary Dressing Zetuvit Plus Silicone Border Dressing 4x4 (in/in) Discharge Instruction: Apply silicone border over primary dressing as directed. Secured With Compression Wrap Compression Stockings Facilities manager) Signed: 08/28/2022 4:44:49 PM By: Shawn Stall RN, BSN Entered By: Shawn Stall on 08/28/2022 14:36:28 -------------------------------------------------------------------------------- Vitals Details Patient Name: Date of Service: Misty Vega. 08/28/2022 2:15 PM Medical Record Number: [id number] Patient Account Number: [id number] Date of Birth/Sex: Treating RN: 14-Nov-1950 (72 y.o. Misty Vega, Misty Vega Primary Care Nakiah Osgood: Misty Vega Other Clinician: Referring Maeli Spacek: Treating Jyoti Harju/Extender: Misty Vega, Misty Vega in Treatment: 1 Vital Signs Time Taken: 14:33 Temperature (F): 97.9 Height (in): 65 Pulse (bpm): 74 Weight (lbs): 164 Respiratory Rate (breaths/min): 20 Body Mass Index (BMI): 27.3 Blood Pressure (mmHg): 161/85 Reference Range: 80 - 120 mg / dl Electronic Signature(s) Signed: 08/28/2022  4:44:49 PM By: Shawn Stall RN, BSN Entered By: Shawn Stall on 08/28/2022 14:33:40

## 2022-09-04 ENCOUNTER — Encounter (HOSPITAL_BASED_OUTPATIENT_CLINIC_OR_DEPARTMENT_OTHER): Payer: Medicare HMO | Admitting: Physician Assistant

## 2022-09-04 DIAGNOSIS — L97822 Non-pressure chronic ulcer of other part of left lower leg with fat layer exposed: Secondary | ICD-10-CM | POA: Diagnosis not present

## 2022-09-04 DIAGNOSIS — S8012XA Contusion of left lower leg, initial encounter: Secondary | ICD-10-CM | POA: Diagnosis not present

## 2022-09-04 DIAGNOSIS — S81812A Laceration without foreign body, left lower leg, initial encounter: Secondary | ICD-10-CM | POA: Diagnosis not present

## 2022-09-04 NOTE — Progress Notes (Signed)
Misty Vega, Misty Vega ([id number]) [id number] Page 1 of 5 Visit Report for 09/04/2022 Chief Complaint Document Details Patient Name: Date of Service: Misty Vega, Misty Vega 09/04/2022 8:45 A M Medical Record Number: [id number] Patient Account Number: [id number] Date of Birth/Sex: Treating RN: [birth year] (72 y.o. F) Primary Care Provider: Mort Sawyers Other Clinician: Referring Provider: Treating Provider/Extender: Suella Grove, Tabitha Weeks in Treatment: 2 Information Obtained from: Patient Chief Complaint Left leg ulcer Electronic Signature(s) Signed: 09/04/2022 9:08:26 AM By: Allen Derry PA-C Entered By: Allen Derry on 09/04/2022 09:08:25 -------------------------------------------------------------------------------- HPI Details Patient Name: Date of Service: Misty Rua. 09/04/2022 8:45 A M Medical Record Number: [id number] Patient Account Number: [id number] Date of Birth/Sex: Treating RN: 01/24/1951 (72 y.o. F) Primary Care Provider: Mort Sawyers Other Clinician: Referring Provider: Treating Provider/Extender: Suella Grove, Tabitha Weeks in Treatment: 2 History of Present Illness HPI Description: 08-21-2022 upon evaluation today patient presents for initial inspection here in our clinic concerning issue that occurred on April 1 when she was outside chopping monkey grass with an ax. She tells me that she went to swing the ax and inadvertently hit herself in the anterior portion of the shin. The handle is what hit her thank goodness not the ax head. Nonetheless she has been having issues try to get this healed since that time has now been about 3 weeks just a little bit over. She initially was on antibiotics she is not sure the name however this had to be changed after the culture showed that she had Pseudomonas present she was switched to Levaquin and she has been taking that since Friday which is about 4 to 5 days ago. She does have a  current regimen of use an antibiotic ointment after cleaning with soap and water. She uses a hypoallergenic soap. Fortunately I do not see any signs of active infection locally or systemically which is great news. She does have some irritation around the edges of the wound I think this may be actually allergic reaction to the neomycin and the Neosporin as everything else she is using is really hypoallergenic. The patient does have a history of minimal swelling of her legs but otherwise no major medical problems. 08-28-2022 upon evaluation today patient's wound is actually showing signs of significant improvement. This is pretty much about a night and day difference compared to last week this week. I am actually very pleased with where we stand I think that she is doing quite well. I do not see any signs of irritation and the wound looks much cleaner I think we are at a point where Xeroform probably is not even necessary I think a little bit of collagen would be perfect at this point. 09-04-2022 upon evaluation today patient appears to be doing well currently in regard to her wound. She has been tolerating the dressing changes with collagen without complication at this point in general and very pleased with where we stand today. I do not see any evidence of active infection locally nor systemically which is great news. Electronic Signature(s) Signed: 09/04/2022 9:23:38 AM By: Allen Derry PA-C Entered By: Allen Derry on 09/04/2022 09:23:38 -------------------------------------------------------------------------------- Physical Exam Details Patient Name: Date of Service: Misty Vega, Misty Vega 09/04/2022 8:45 A M Medical Record Number: [id number] Patient Account Number: [id number] Date of Birth/Sex: Treating RN: 03/18/1951 (72 y.o. F) Primary Care Provider: Mort Sawyers Other Clinician: Referring Provider: Treating Provider/Extender: Suella Grove, Tabitha Weeks in Treatment:  2 Constitutional Misty Vega, Misty Vega ([id number]) [id number]  Page 2 of 5 Well-nourished and well-hydrated in no acute distress. Respiratory normal breathing without difficulty. Psychiatric this patient is able to make decisions and demonstrates good insight into disease process. Alert and Oriented x 3. pleasant and cooperative. Notes Upon inspection patient's wound bed actually showed signs of good granulation epithelization at this point. She has been using the collagen with great results and I do not see any signs of infection locally nor systemically which is great news. Electronic Signature(s) Signed: 09/04/2022 9:30:34 AM By: Allen Derry PA-C Entered By: Allen Derry on 09/04/2022 09:30:34 -------------------------------------------------------------------------------- Physician Orders Details Patient Name: Date of Service: Misty Rua. 09/04/2022 8:45 A M Medical Record Number: [id number] Patient Account Number: [id number] Date of Birth/Sex: Treating RN: [birth year] (72 y.o. Misty Vega, Misty Vega Primary Care Provider: Mort Sawyers Other Clinician: Referring Provider: Treating Provider/Extender: Daisey Must Weeks in Treatment: 2 Verbal / Phone Orders: No Diagnosis Coding ICD-10 Coding Code Description [phone] Laceration without foreign body, left lower leg, initial encounter L97.822 Non-pressure chronic ulcer of other part of left lower leg with fat layer exposed S80.12XA Contusion of left lower leg, initial encounter Follow-up Appointments ppointment in 1 week. Allen Derry PA, Wednesday 09/11/2022 0930 room 8 Return A Other: - [facility]- wound care supplier. TCA cream applied to around the wound bed-pick up from pharmacy. Anesthetic (In clinic) Topical Lidocaine 5% applied to wound bed Bathing/ Shower/ Hygiene May shower and wash wound with soap and water. Edema Control - Lymphedema / SCD / Other Elevate legs to the level of the  heart or above for 30 minutes daily and/or when sitting for 3-4 times a day throughout the day. Avoid standing for long periods of time. Exercise regularly Moisturize legs daily. - every night before bed. Wound Treatment Wound #1 - Lower Leg Wound Laterality: Left, Anterior Cleanser: Soap and Water Every Other Day/30 Days Discharge Instructions: May shower and wash wound with dial antibacterial soap and water prior to dressing change. Cleanser: [facility] Ancillary Kit - 15 Day Supply (Generic) Every Other Day/30 Days Discharge Instructions: Use supplies as instructed; Kit contains: (15) Saline Bullets; (15) 3x3 Gauze; 15 pr Gloves Peri-Wound Care: Skin Prep (Generic) Every Other Day/30 Days Discharge Instructions: Use skin prep as directed Prim Dressing: Fibracol Plus Dressing, 4x4.38 in (collagen) Every Other Day/30 Days ary Discharge Instructions: Moisten collagen with saline or hydrogel Secondary Dressing: Zetuvit Plus Silicone Border Dressing 4x4 (in/in) (Generic) Every Other Day/30 Days Discharge Instructions: Apply silicone border over primary dressing as directed. Electronic Signature(s) Misty Vega, Misty Vega ([id number]) [id number] Page 3 of 5 Unsigned Entered By: Shawn Stall on 09/04/2022 09:14:33 -------------------------------------------------------------------------------- Problem List Details Patient Name: Date of Service: Misty Vega, Misty Vega 09/04/2022 8:45 A M Medical Record Number: [id number] Patient Account Number: [id number] Date of Birth/Sex: Treating RN: May 23, 1950 (72 y.o. F) Primary Care Provider: Mort Sawyers Other Clinician: Referring Provider: Treating Provider/Extender: Suella Grove, Tabitha Weeks in Treatment: 2 Active Problems ICD-10 Encounter Code Description Active Date MDM Diagnosis S81.812A Laceration without foreign body, left lower leg, initial encounter 08/21/2022 No Yes L97.822 Non-pressure chronic ulcer  of other part of left lower leg with fat layer exposed4/24/2024 No Yes S80.12XA Contusion of left lower leg, initial encounter 08/21/2022 No Yes Inactive Problems Resolved Problems Electronic Signature(s) Signed: 09/04/2022 9:08:13 AM By: Allen Derry PA-C Entered By: Allen Derry on 09/04/2022 09:08:13 -------------------------------------------------------------------------------- Progress Note Details Patient Name: Date of Service: Misty Rua. 09/04/2022 8:45 A M Medical Record Number: [id number] Patient  Account Number: [id number] Date of Birth/Sex: Treating RN: 12-06-1950 (72 y.o. F) Primary Care Provider: Mort Sawyers Other Clinician: Referring Provider: Treating Provider/Extender: Suella Grove, Tabitha Weeks in Treatment: 2 Subjective Chief Complaint Information obtained from Patient Left leg ulcer History of Present Illness (HPI) 08-21-2022 upon evaluation today patient presents for initial inspection here in our clinic concerning issue that occurred on April 1 when she was outside chopping monkey grass with an ax. She tells me that she went to swing the ax and inadvertently hit herself in the anterior portion of the shin. The handle is what hit her thank goodness not the ax head. Nonetheless she has been having issues try to get this healed since that time has now been about 3 weeks just a little bit over. She initially was on antibiotics she is not sure the name however this had to be changed after the culture showed that she had Pseudomonas present she was switched to Levaquin and she has been taking that since Friday which is about 4 to 5 days ago. She does have a current regimen of use an antibiotic ointment after cleaning with soap and water. She uses a hypoallergenic soap. Fortunately I do not see any signs of active infection locally or systemically which is great news. She does have some irritation around the edges of the wound I think this may be actually  allergic reaction to the neomycin and the Neosporin as everything else she is using is really hypoallergenic. The patient does have a history of minimal swelling of her legs but otherwise no major medical problems. 08-28-2022 upon evaluation today patient's wound is actually showing signs of significant improvement. This is pretty much about a night and day difference compared to last week this week. I am actually very pleased with where we stand I think that she is doing quite well. I do not see any signs of irritation and the wound looks much cleaner I think we are at a point where Xeroform probably is not even necessary I think a little bit of collagen would be perfect at this point. Misty Vega, Misty Vega ([id number]) [id number] Page 4 of 5 09-04-2022 upon evaluation today patient appears to be doing well currently in regard to her wound. She has been tolerating the dressing changes with collagen without complication at this point in general and very pleased with where we stand today. I do not see any evidence of active infection locally nor systemically which is great news. Objective Constitutional Well-nourished and well-hydrated in no acute distress. Vitals Time Taken: 9:04 AM, Height: 65 in, Weight: 164 lbs, BMI: 27.3, Temperature: 98.3 F, Pulse: 70 bpm, Respiratory Rate: 18 breaths/min, Blood Pressure: 161/72 mmHg. Respiratory normal breathing without difficulty. Psychiatric this patient is able to make decisions and demonstrates good insight into disease process. Alert and Oriented x 3. pleasant and cooperative. General Notes: Upon inspection patient's wound bed actually showed signs of good granulation epithelization at this point. She has been using the collagen with great results and I do not see any signs of infection locally nor systemically which is great news. Integumentary (Hair, Skin) Wound #1 status is Open. Original cause of wound was Trauma. The date  acquired was: 07/29/2022. The wound has been in treatment 2 weeks. The wound is located on the Left,Anterior Lower Leg. The wound measures 0.4cm length x 0.2cm width x 0.2cm depth; 0.063cm^2 area and 0.013cm^3 volume. There is Fat Layer (Subcutaneous Tissue) exposed. There is no tunneling or undermining noted. There  is a medium amount of serosanguineous drainage noted. The wound margin is distinct with the outline attached to the wound base. There is large (67-100%) red granulation within the wound bed. There is a small (1-33%) amount of necrotic tissue within the wound bed including Adherent Slough. The periwound skin appearance did not exhibit: Callus, Crepitus, Excoriation, Induration, Rash, Scarring, Dry/Scaly, Maceration, Atrophie Blanche, Cyanosis, Ecchymosis, Hemosiderin Staining, Mottled, Pallor, Rubor, Erythema. Periwound temperature was noted as No Abnormality. The periwound has tenderness on palpation. Assessment Active Problems ICD-10 Laceration without foreign body, left lower leg, initial encounter Non-pressure chronic ulcer of other part of left lower leg with fat layer exposed Contusion of left lower leg, initial encounter Plan Follow-up Appointments: Return Appointment in 1 week. Allen Derry PA, Wednesday 09/11/2022 0930 room 8 Other: - [facility]- wound care supplier. TCA cream applied to around the wound bed-pick up from pharmacy. Anesthetic: (In clinic) Topical Lidocaine 5% applied to wound bed Bathing/ Shower/ Hygiene: May shower and wash wound with soap and water. Edema Control - Lymphedema / SCD / Other: Elevate legs to the level of the heart or above for 30 minutes daily and/or when sitting for 3-4 times a day throughout the day. Avoid standing for long periods of time. Exercise regularly Moisturize legs daily. - every night before bed. WOUND #1: - Lower Leg Wound Laterality: Left, Anterior Cleanser: Soap and Water Every Other Day/30 Days Discharge Instructions: May  shower and wash wound with dial antibacterial soap and water prior to dressing change. Cleanser: [facility] Ancillary Kit - 15 Day Supply (Generic) Every Other Day/30 Days Discharge Instructions: Use supplies as instructed; Kit contains: (15) Saline Bullets; (15) 3x3 Gauze; 15 pr Gloves Peri-Wound Care: Skin Prep (Generic) Every Other Day/30 Days Discharge Instructions: Use skin prep as directed Prim Dressing: Fibracol Plus Dressing, 4x4.38 in (collagen) Every Other Day/30 Days ary Discharge Instructions: Moisten collagen with saline or hydrogel Secondary Dressing: Zetuvit Plus Silicone Border Dressing 4x4 (in/in) (Generic) Every Other Day/30 Days Discharge Instructions: Apply silicone border over primary dressing as directed. 1. Based on what I am seeing I do believe that the patient is making great progress I think she is very close to complete resolution my hope is that this will be Misty Vega, Misty Vega ([id number]) [id number] Page 5 of 5 resolved and not next week at least by the following. 2. I am going to suggest as well that the patient should continue to utilize specifically the collagen based dressing followed by the bordered foam dressing which I think is doing a really good job here. We will see patient back for reevaluation in 1 week here in the clinic. If anything worsens or changes patient will contact our office for additional recommendations. Electronic Signature(s) Signed: 09/04/2022 9:46:55 AM By: Allen Derry PA-C Entered By: Allen Derry on 09/04/2022 09:46:54 -------------------------------------------------------------------------------- SuperBill Details Patient Name: Date of Service: Misty Rua 09/04/2022 Medical Record Number: [id number] Patient Account Number: [id number] Date of Birth/Sex: Treating RN: 07-13-1950 (72 y.o. Arta Silence Primary Care Provider: Mort Sawyers Other Clinician: Referring Provider: Treating Provider/Extender:  Suella Grove, Tabitha Weeks in Treatment: 2 Diagnosis Coding ICD-10 Codes Code Description [phone] Laceration without foreign body, left lower leg, initial encounter L97.822 Non-pressure chronic ulcer of other part of left lower leg with fat layer exposed S80.12XA Contusion of left lower leg, initial encounter Facility Procedures : CPT4 Code: [id number] Description: 99213 - WOUND CARE VISIT-LEV 3 EST PT Modifier: Quantity: 1 Physician Procedures : CPT4 Code Description Modifier  [id number] 99213 - WC PHYS LEVEL 3 - EST PT ICD-10 Diagnosis Description S81.812A Laceration without foreign body, left lower leg, initial encounter L97.822 Non-pressure chronic ulcer of other part of left lower leg with fat  layer exposed S80.12XA Contusion of left lower leg, initial encounter Quantity: 1 Electronic Signature(s) Signed: 09/04/2022 9:47:16 AM By: Allen Derry PA-C Entered By: Allen Derry on 09/04/2022 09:47:15

## 2022-09-04 NOTE — Progress Notes (Signed)
ELLENDER, BAILER ([id number]) [id number] Page 1 of 7 Visit Report for 09/04/2022 Arrival Information Details Patient Name: Date of Service: Misty Vega, Misty Vega 09/04/2022 8:45 A M Medical Record Number: [id number] Patient Account Number: [id number] Date of Birth/Sex: Treating RN: 1950/08/14 (72 y.o. Orville Govern Primary Care Lillianne Eick: Mort Sawyers Other Clinician: Referring Thayer Embleton: Treating Woodard Perrell/Extender: Daisey Must Weeks in Treatment: 2 Visit Information History Since Last Visit Added or deleted any medications: No Patient Arrived: Ambulatory Any new allergies or adverse reactions: No Arrival Time: 09:02 Had a fall or experienced change in No Accompanied By: self activities of daily living that may affect Transfer Assistance: None risk of falls: Patient Requires Transmission-Based Precautions: No Signs or symptoms of abuse/neglect since last visito No Patient Has Alerts: No Hospitalized since last visit: No Implantable device outside of the clinic excluding No cellular tissue based products placed in the center since last visit: Has Dressing in Place as Prescribed: Yes Pain Present Now: No Electronic Signature(s) Signed: 09/04/2022 4:03:05 PM By: Redmond Pulling RN, BSN Entered By: Redmond Pulling on 09/04/2022 09:04:00 -------------------------------------------------------------------------------- Clinic Level of Care Assessment Details Patient Name: Date of Service: Misty Vega, Misty Vega 09/04/2022 8:45 A M Medical Record Number: [id number] Patient Account Number: [id number] Date of Birth/Sex: Treating RN: 02/21/1951 (72 y.o. [name], Yvonne Kendall Primary Care Kailen Name: Mort Sawyers Other Clinician: Referring Beulah Matusek: Treating Laila Myhre/Extender: Daisey Must Weeks in Treatment: 2 Clinic Level of Care Assessment Items TOOL 4 Quantity Score X- 1 0 Use when only an EandM is performed on FOLLOW-UP  visit ASSESSMENTS - Nursing Assessment / Reassessment X- 1 10 Reassessment of Co-morbidities (includes updates in patient status) X- 1 5 Reassessment of Adherence to Treatment Plan ASSESSMENTS - Wound and Skin A ssessment / Reassessment X - Simple Wound Assessment / Reassessment - one wound 1 5 []  - 0 Complex Wound Assessment / Reassessment - multiple wounds []  - 0 Dermatologic / Skin Assessment (not related to wound area) ASSESSMENTS - Focused Assessment X- 1 5 Circumferential Edema Measurements - multi extremities []  - 0 Nutritional Assessment / Counseling / Intervention []  - 0 Lower Extremity Assessment (monofilament, tuning fork, pulses) []  - 0 Peripheral Arterial Disease Assessment (using hand held doppler) ASSESSMENTS - Ostomy and/or Continence Assessment and Care []  - 0 Incontinence Assessment and Management []  - 0 Ostomy Care Assessment and Management (repouching, etc.) PROCESS - Coordination of Care X - Simple Patient / Family Education for ongoing care 1 15 RAELEI, BOUSMAN ([id number]) [id number] Page 2 of 7 []  - 0 Complex (extensive) Patient / Family Education for ongoing care X- 1 10 Staff obtains Chiropractor, Records, T Results / Process Orders est []  - 0 Staff telephones HHA, Nursing Homes / Clarify orders / etc []  - 0 Routine Transfer to another Facility (non-emergent condition) []  - 0 Routine Hospital Admission (non-emergent condition) []  - 0 New Admissions / Manufacturing engineer / Ordering NPWT Apligraf, etc. , []  - 0 Emergency Hospital Admission (emergent condition) X- 1 10 Simple Discharge Coordination []  - 0 Complex (extensive) Discharge Coordination PROCESS - Special Needs []  - 0 Pediatric / Minor Patient Management []  - 0 Isolation Patient Management []  - 0 Hearing / Language / Visual special needs []  - 0 Assessment of Community assistance (transportation, D/C planning, etc.) []  - 0 Additional assistance /  Altered mentation []  - 0 Support Surface(s) Assessment (bed, cushion, seat, etc.) INTERVENTIONS - Wound Cleansing / Measurement X - Simple Wound Cleansing - one wound 1 5 []  -  0 Complex Wound Cleansing - multiple wounds X- 1 5 Wound Imaging (photographs - any number of wounds) []  - 0 Wound Tracing (instead of photographs) X- 1 5 Simple Wound Measurement - one wound []  - 0 Complex Wound Measurement - multiple wounds INTERVENTIONS - Wound Dressings X - Small Wound Dressing one or multiple wounds 1 10 []  - 0 Medium Wound Dressing one or multiple wounds []  - 0 Large Wound Dressing one or multiple wounds []  - 0 Application of Medications - topical []  - 0 Application of Medications - injection INTERVENTIONS - Miscellaneous []  - 0 External ear exam []  - 0 Specimen Collection (cultures, biopsies, blood, body fluids, etc.) []  - 0 Specimen(s) / Culture(s) sent or taken to Lab for analysis []  - 0 Patient Transfer (multiple staff / Nurse, adult / Similar devices) []  - 0 Simple Staple / Suture removal (25 or less) []  - 0 Complex Staple / Suture removal (26 or more) []  - 0 Hypo / Hyperglycemic Management (close monitor of Blood Glucose) []  - 0 Ankle / Brachial Index (ABI) - do not check if billed separately X- 1 5 Vital Signs Has the patient been seen at the hospital within the last three years: Yes Total Score: 90 Level Of Care: New/Established - Level 3 Electronic Signature(s) Signed: 09/04/2022 11:31:33 AM By: Shawn Stall RN, BSN Entered By: Shawn Stall on 09/04/2022 09:15:32 [name] ([id number]) [id number] Page 3 of 7 -------------------------------------------------------------------------------- Encounter Discharge Information Details Patient Name: Date of Service: Misty Vega, Misty Vega 09/04/2022 8:45 A M Medical Record Number: [id number] Patient Account Number: [id number] Date of Birth/Sex: Treating RN: [birth year] (72 y.o. Arta Silence Primary Care Samaiyah Howes: Mort Sawyers Other Clinician: Referring Vitor Overbaugh: Treating Andilyn Bettcher/Extender: Suella Grove, Tabitha Weeks in Treatment: 2 Encounter Discharge Information Items Discharge Condition: Stable Ambulatory Status: Ambulatory Discharge Destination: Home Transportation: Private Auto Accompanied By: self Schedule Follow-up Appointment: Yes Clinical Summary of Care: Electronic Signature(s) Signed: 09/04/2022 11:31:33 AM By: Shawn Stall RN, BSN Entered By: Shawn Stall on 09/04/2022 09:15:55 -------------------------------------------------------------------------------- Lower Extremity Assessment Details Patient Name: Date of Service: Misty Rua. 09/04/2022 8:45 A M Medical Record Number: [id number] Patient Account Number: [id number] Date of Birth/Sex: Treating RN: 1950-12-04 (72 y.o. Orville Govern Primary Care Karrington Mccravy: Mort Sawyers Other Clinician: Referring Rashee Marschall: Treating Kimm Ungaro/Extender: Suella Grove, Tabitha Weeks in Treatment: 2 Edema Assessment Assessed: [Left: No] [Right: No] Edema: [Left: N] [Right: o] Calf Left: Right: Point of Measurement: 37 cm From Medial Instep 35 cm Ankle Left: Right: Point of Measurement: 8 cm From Medial Instep 22 cm Vascular Assessment Pulses: Dorsalis Pedis Palpable: [Left:Yes] Electronic Signature(s) Signed: 09/04/2022 4:03:05 PM By: Redmond Pulling RN, BSN Entered By: Redmond Pulling on 09/04/2022 09:05:00 -------------------------------------------------------------------------------- Multi-Disciplinary Care Plan Details Patient Name: Date of Service: Misty Vega, Misty Agar. 09/04/2022 8:45 A M Medical Record Number: [id number] Patient Account Number: [id number] Date of Birth/Sex: Treating RN: [birth year] (72 y.o. Arta Silence Primary Care Tong Pieczynski: Mort Sawyers Other Clinician: Referring Jeyla Bulger: Treating Cruze Zingaro/Extender: Suella Grove, Tabitha Weeks in Treatment:  2 Misty Vega, Misty Vega ([id number]) [id number] Page 4 of 7 Active Inactive Wound/Skin Impairment Nursing Diagnoses: Knowledge deficit related to ulceration/compromised skin integrity Goals: Patient/caregiver will verbalize understanding of skin care regimen Date Initiated: 08/21/2022 Target Resolution Date: 09/11/2022 Goal Status: Active Interventions: Assess patient/caregiver ability to perform ulcer/skin care regimen upon admission and as needed Assess ulceration(s) every visit Provide education on ulcer and skin care Treatment Activities: Skin care regimen initiated :  08/21/2022 Topical wound management initiated : 08/21/2022 Notes: Electronic Signature(s) Signed: 09/04/2022 11:31:33 AM By: Shawn Stall RN, BSN Entered By: Shawn Stall on 09/04/2022 09:14:52 -------------------------------------------------------------------------------- Pain Assessment Details Patient Name: Date of Service: Misty Rua. 09/04/2022 8:45 A M Medical Record Number: [id number] Patient Account Number: [id number] Date of Birth/Sex: Treating RN: [birth year] (72 y.o. Orville Govern Primary Care Adalynn Corne: Mort Sawyers Other Clinician: Referring Dearion Huot: Treating Messiah Rovira/Extender: Daisey Must Weeks in Treatment: 2 Active Problems Location of Pain Severity and Description of Pain Patient Has Paino No Site Locations Pain Management and Medication Current Pain Management: Electronic Signature(s) Signed: 09/04/2022 4:03:05 PM By: Redmond Pulling RN, BSN Entered By: Redmond Pulling on 09/04/2022 09:04:26 [name] ([id number]) [id number] Page 5 of 7 -------------------------------------------------------------------------------- Patient/Caregiver Education Details Patient Name: Date of Service: Misty Vega, Misty Vega 5/8/2024andnbsp8:45 A M Medical Record Number: [id number] Patient Account Number: [id number] Date of  Birth/Gender: Treating RN: [birth year] (72 y.o. Arta Silence Primary Care Physician: Mort Sawyers Other Clinician: Referring Physician: Treating Physician/Extender: Alonza Smoker in Treatment: 2 Education Assessment Education Provided To: Patient Education Topics Provided Wound/Skin Impairment: Handouts: Caring for Your Ulcer Methods: Explain/Verbal Responses: Reinforcements needed Electronic Signature(s) Signed: 09/04/2022 11:31:33 AM By: Shawn Stall RN, BSN Entered By: Shawn Stall on 09/04/2022 09:15:03 -------------------------------------------------------------------------------- Wound Assessment Details Patient Name: Date of Service: Misty Rua. 09/04/2022 8:45 A M Medical Record Number: [id number] Patient Account Number: [id number] Date of Birth/Sex: Treating RN: Mar 18, 1951 (72 y.o. Orville Govern Primary Care Donell Tomkins: Mort Sawyers Other Clinician: Referring Bula Cavalieri: Treating Shawne Bulow/Extender: Suella Grove, Tabitha Weeks in Treatment: 2 Wound Status Wound Number: 1 Primary Etiology: Abrasion Wound Location: Left, Anterior Lower Leg Wound Status: Open Wounding Event: Trauma Date Acquired: 07/29/2022 Weeks Of Treatment: 2 Clustered Wound: Yes Photos Wound Measurements Length: (cm) Width: (cm) Depth: (cm) Clustered Quantity: Area: (cm) Volume: (cm) 0.4 % Reduction in Area: 97% 0.2 % Reduction in Volume: 93.9% 0.2 Epithelialization: Large (67-100%) 1 Tunneling: No 0.063 Undermining: No 0.013 Wound Description Classification: Full Thickness Without Exposed Support Structures Wound Margin: Distinct, outline attached Misty Vega, Misty Vega ([id number]) Exudate Amount: Medium Exudate Type: Serosanguineous Exudate Color: red, brown Foul Odor After Cleansing: No Slough/Fibrino Yes [phone] Page 6 of 7 Wound Bed Granulation Amount: Large (67-100%) Exposed Structure Granulation Quality:  Red Fascia Exposed: No Necrotic Amount: Small (1-33%) Fat Layer (Subcutaneous Tissue) Exposed: Yes Necrotic Quality: Adherent Slough Tendon Exposed: No Muscle Exposed: No Joint Exposed: No Bone Exposed: No Periwound Skin Texture Texture Color No Abnormalities Noted: No No Abnormalities Noted: No Callus: No Atrophie Blanche: No Crepitus: No Cyanosis: No Excoriation: No Ecchymosis: No Induration: No Erythema: No Rash: No Hemosiderin Staining: No Scarring: No Mottled: No Pallor: No Moisture Rubor: No No Abnormalities Noted: No Dry / Scaly: No Temperature / Pain Maceration: No Temperature: No Abnormality Tenderness on Palpation: Yes Treatment Notes Wound #1 (Lower Leg) Wound Laterality: Left, Anterior Cleanser Soap and Water Discharge Instruction: May shower and wash wound with dial antibacterial soap and water prior to dressing change. [facility] Ancillary Kit - 15 Day Supply Discharge Instruction: Use supplies as instructed; Kit contains: (15) Saline Bullets; (15) 3x3 Gauze; 15 pr Gloves Peri-Wound Care Skin Prep Discharge Instruction: Use skin prep as directed Topical Primary Dressing Fibracol Plus Dressing, 4x4.38 in (collagen) Discharge Instruction: Moisten collagen with saline or hydrogel Secondary Dressing Zetuvit Plus Silicone Border Dressing 4x4 (in/in) Discharge Instruction: Apply silicone border over primary dressing as directed. Secured With  Compression Wrap Compression Stockings Add-Ons Electronic Signature(s) Signed: 09/04/2022 11:31:33 AM By: Shawn Stall RN, BSN Signed: 09/04/2022 4:03:05 PM By: Redmond Pulling RN, BSN Entered By: Shawn Stall on 09/04/2022 09:06:53 -------------------------------------------------------------------------------- Vitals Details Patient Name: Date of Service: Misty Rua. 09/04/2022 8:45 A M Medical Record Number: [id number] Patient Account Number: [id number] Date of Birth/Sex: Treating RN: 1951-03-06 (72 y.o. Orville Govern Primary Care Chennel Olivos: Mort Sawyers Other Clinician: Referring Lazara Grieser: Treating Lendell Gallick/Extender: Janine Ores ([id number]) [id number] Page 7 of 7 Weeks in Treatment: 2 Vital Signs Time Taken: 09:04 Temperature (F): 98.3 Height (in): 65 Pulse (bpm): 70 Weight (lbs): 164 Respiratory Rate (breaths/min): 18 Body Mass Index (BMI): 27.3 Blood Pressure (mmHg): 161/72 Reference Range: 80 - 120 mg / dl Electronic Signature(s) Signed: 09/04/2022 4:03:05 PM By: Redmond Pulling RN, BSN Entered By: Redmond Pulling on 09/04/2022 09:04:20

## 2022-09-11 ENCOUNTER — Ambulatory Visit (HOSPITAL_BASED_OUTPATIENT_CLINIC_OR_DEPARTMENT_OTHER): Payer: Medicare HMO | Admitting: General Surgery

## 2022-09-11 ENCOUNTER — Encounter (HOSPITAL_BASED_OUTPATIENT_CLINIC_OR_DEPARTMENT_OTHER): Payer: Medicare HMO | Admitting: Physician Assistant

## 2022-09-11 DIAGNOSIS — L97822 Non-pressure chronic ulcer of other part of left lower leg with fat layer exposed: Secondary | ICD-10-CM | POA: Diagnosis not present

## 2022-09-11 DIAGNOSIS — S8012XA Contusion of left lower leg, initial encounter: Secondary | ICD-10-CM | POA: Diagnosis not present

## 2022-09-11 DIAGNOSIS — S81812A Laceration without foreign body, left lower leg, initial encounter: Secondary | ICD-10-CM | POA: Diagnosis not present

## 2023-01-21 ENCOUNTER — Ambulatory Visit (INDEPENDENT_AMBULATORY_CARE_PROVIDER_SITE_OTHER): Payer: Medicare HMO

## 2023-01-21 VITALS — Ht 66.0 in | Wt 165.0 lb

## 2023-01-21 DIAGNOSIS — Z0000 Encounter for general adult medical examination without abnormal findings: Secondary | ICD-10-CM | POA: Diagnosis not present

## 2023-01-21 NOTE — Progress Notes (Signed)
Subjective:   Misty Vega is a 72 y.o. female who presents for Medicare Annual (Subsequent) preventive examination.  Visit Complete: Virtual  I connected with  [name] on 01/21/23 by a audio enabled telemedicine application and verified that I am speaking with the correct person using two identifiers.  Patient Location: Home  Provider Location: Home Office  I discussed the limitations of evaluation and management by telemedicine. The patient expressed understanding and agreed to proceed.    Cardiac Risk Factors include: advanced age (>83men, >68 women)     Objective:    Today's Vitals   01/21/23 0852  Weight: 165 lb (74.8 kg)  Height: 5\' 6"  (1.676 m)   Body mass index is 26.63 kg/m.     01/21/2023    8:59 AM 02/12/2022    9:35 AM  Advanced Directives  Does Patient Have a Medical Advance Directive? No No  Would patient like information on creating a medical advance directive? No - Patient declined No - Patient declined    Current Medications (verified) Outpatient Encounter Medications as of 01/21/2023  Medication Sig   Calcium-Vitamin D-Vitamin K (VIACTIV PO) Take 1 capsule by mouth daily.    Multiple Vitamin (MULTIVITAMIN WITH MINERALS) TABS tablet Take 1 tablet by mouth daily.   No facility-administered encounter medications on file as of 01/21/2023.    Allergies (verified) Patient has no known allergies.   History: History reviewed. No pertinent past medical history. Past Surgical History:  Procedure Laterality Date   APPENDECTOMY     CATARACT EXTRACTION     COLONOSCOPY     POLYPECTOMY     Family History  Problem Relation Age of Onset   Liver cancer Mother        non drinker   Hypertension Father    Colon polyps Sister    Diabetes Neg Hx    Heart disease Neg Hx    Stroke Neg Hx    Colon cancer Neg Hx    Esophageal cancer Neg Hx    Stomach cancer Neg Hx    Rectal cancer Neg Hx    Social History   Socioeconomic  History   Marital status: Married    Spouse name: Not on file   Number of children: 1   Years of education: Not on file   Highest education level: Not on file  Occupational History   Not on file  Tobacco Use   Smoking status: Never   Smokeless tobacco: Never  Vaping Use   Vaping status: Never Used  Substance and Sexual Activity   Alcohol use: No    Alcohol/week: 0.0 standard drinks of alcohol   Drug use: No   Sexual activity: Yes    Partners: Male    Birth control/protection: Post-menopausal  Other Topics Concern   Not on file  Social History Narrative   One son, deceased   One step daugther    Social Determinants of Health   Financial Resource Strain: Low Risk  (01/21/2023)   Overall Financial Resource Strain (CARDIA)    Difficulty of Paying Living Expenses: Not hard at all  Food Insecurity: No Food Insecurity (01/21/2023)   Hunger Vital Sign    Worried About Running Out of Food in the Last Year: Never true    Ran Out of Food in the Last Year: Never true  Transportation Needs: No Transportation Needs (01/21/2023)   PRAPARE - Administrator, Civil Service (Medical): No    Lack of Transportation (Non-Medical): No  Physical Activity: Sufficiently Active (01/21/2023)   Exercise Vital Sign    Days of Exercise per Week: 5 days    Minutes of Exercise per Session: 30 min  Stress: No Stress Concern Present (01/21/2023)   [employer] of Occupational Health - Occupational Stress Questionnaire    Feeling of Stress : Not at all  Social Connections: Socially Integrated (01/21/2023)   Social Connection and Isolation Panel [NHANES]    Frequency of Communication with Friends and Family: More than three times a week    Frequency of Social Gatherings with Friends and Family: More than three times a week    Attends Religious Services: More than 4 times per year    Active Member of [employer] or Organizations: Yes    Attends Engineer, structural: More than 4 times per  year    Marital Status: Married    Tobacco Counseling Counseling given: Not Answered   Clinical Intake:  Pre-visit preparation completed: Yes Vital Signs: Unable to obtain new vitals due to this being a telehealth visit.  Pain : No/denies pain     BMI - recorded: 26.63 Nutritional Status: BMI 25 -29 Overweight Nutritional Risks: None Diabetes: No  How often do you need to have someone help you when you read instructions, pamphlets, or other written materials from your doctor or pharmacy?: 1 - Never  Interpreter Needed?: No  Information entered by :: Theresa Mulligan LPN   Activities of Daily Living    01/21/2023    8:58 AM 02/12/2022    9:43 AM  In your present state of health, do you have any difficulty performing the following activities:  Hearing? 0 0  Vision? 0 0  Difficulty concentrating or making decisions? 0 0  Walking or climbing stairs? 0   Dressing or bathing? 0 0  Doing errands, shopping? 0 0  Preparing Food and eating ? N N  Using the Toilet? N N  In the past six months, have you accidently leaked urine? N N  Do you have problems with loss of bowel control? N N  Managing your Medications? N N  Managing your Finances? N N  Housekeeping or managing your Housekeeping? N N    Patient Care Team: Mort Sawyers, FNP as PCP - General (Family Medicine)  Indicate any recent Medical Services you may have received from other than [facility] providers in the past year (date may be approximate).     Assessment:   This is a routine wellness examination for Misty Vega.  Hearing/Vision screen Hearing Screening - Comments:: Denies hearing difficulties   Vision Screening - Comments:: - up to date with routine eye exams with  Dr Rubye Oaks   Goals Addressed               This Visit's Progress     Live a healthy life (pt-stated)         Depression Screen    01/21/2023    8:57 AM 08/16/2022    8:12 AM 08/16/2022    8:05 AM 08/08/2022    9:12 AM 02/12/2022    9:39 AM  01/22/2018    9:57 AM 05/26/2017   10:50 AM  PHQ 2/9 Scores  PHQ - 2 Score 0 0 0 0 0 0 0  PHQ- 9 Score     0      Fall Risk    01/21/2023    8:58 AM 08/16/2022    8:05 AM 08/08/2022    9:12 AM 06/10/2022    9:51 AM  02/12/2022    9:35 AM  Fall Risk   Falls in the past year? 0 0 0 0 0  Number falls in past yr: 0 0 0 0 0  Injury with Fall? 0 0 0 0 0  Risk for fall due to : No Fall Risks   No Fall Risks   Follow up Falls prevention discussed Falls evaluation completed;Education provided;Falls prevention discussed Falls evaluation completed;Education provided;Falls prevention discussed Falls evaluation completed Falls evaluation completed;Education provided;Falls prevention discussed    MEDICARE RISK AT HOME: Medicare Risk at Home Any stairs in or around the home?: No If so, are there any without handrails?: No Home free of loose throw rugs in walkways, pet beds, electrical cords, etc?: Yes Adequate lighting in your home to reduce risk of falls?: Yes Life alert?: No Use of a cane, walker or w/c?: No Grab bars in the bathroom?: No Shower chair or bench in shower?: Yes Elevated toilet seat or a handicapped toilet?: No  TIMED UP AND GO:  Was the test performed?  No    Cognitive Function:        01/21/2023    8:59 AM 02/12/2022    9:36 AM  6CIT Screen  What Year? 0 points 0 points  What month? 0 points 0 points  What time? 0 points 0 points  Count back from 20 0 points 0 points  Months in reverse 0 points 0 points  Repeat phrase 0 points 0 points  Total Score 0 points 0 points    Immunizations Immunization History  Administered Date(s) Administered   Influenza,inj,Quad PF,6+ Mos 03/05/2018   Janssen (J&J) SARS-COV-2 Vaccination 07/31/2019, 03/06/2020   Pneumococcal Conjugate-13 05/26/2017   Pneumococcal Polysaccharide-23 09/17/2021   Tdap 11/21/2018   Zoster, Live 03/13/2015    TDAP status: Up to date  Flu Vaccine status: Due, Education has been provided regarding  the importance of this vaccine. Advised may receive this vaccine at local pharmacy or Health Dept. Aware to provide a copy of the vaccination record if obtained from local pharmacy or Health Dept. Verbalized acceptance and understanding.  Pneumococcal vaccine status: Up to date  Covid-19 vaccine status: Declined, Education has been provided regarding the importance of this vaccine but patient still declined. Advised may receive this vaccine at local pharmacy or Health Dept.or vaccine clinic. Aware to provide a copy of the vaccination record if obtained from local pharmacy or Health Dept. Verbalized acceptance and understanding.  Qualifies for Shingles Vaccine? Yes   Zostavax completed No   Shingrix Completed?: No.    Education has been provided regarding the importance of this vaccine. Patient has been advised to call insurance company to determine out of pocket expense if they have not yet received this vaccine. Advised may also receive vaccine at local pharmacy or Health Dept. Verbalized acceptance and understanding.  Screening Tests Health Maintenance  Topic Date Due   Zoster Vaccines- Shingrix (1 of 2) 07/03/2001   INFLUENZA VACCINE  11/28/2022   COVID-19 Vaccine (3 - 2023-24 season) 12/29/2022   Medicare Annual Wellness (AWV)  01/21/2024   MAMMOGRAM  03/13/2024   Colonoscopy  12/12/2024   DTaP/Tdap/Td (2 - Td or Tdap) 11/20/2028   Pneumonia Vaccine 77+ Years old  Completed   DEXA SCAN  Completed   Hepatitis C Screening  Completed   HPV VACCINES  Aged Out    Health Maintenance  Health Maintenance Due  Topic Date Due   Zoster Vaccines- Shingrix (1 of 2) 07/03/2001   INFLUENZA VACCINE  11/28/2022   COVID-19 Vaccine (3 - 2023-24 season) 12/29/2022    Colorectal cancer screening: Type of screening: Colonoscopy. Completed 12/12/21. Repeat every 3 years  Mammogram status: Completed 03/13/22. Repeat every year  Bone Density status: Completed 03/13/22. Results reflect: Bone density  results: OSTEOPENIA. Repeat every   years.    Additional Screening:  Hepatitis C Screening: does qualify; Completed 05/26/17  Vision Screening: Recommended annual ophthalmology exams for early detection of glaucoma and other disorders of the eye. Is the patient up to date with their annual eye exam?  Yes  Who is the provider or what is the name of the office in which the patient attends annual eye exams? Dr Rubye Oaks If pt is not established with a provider, would they like to be referred to a provider to establish care? No .   Dental Screening: Recommended annual dental exams for proper oral hygiene   Community Resource Referral / Chronic Care Management:  CRR required this visit?  No   CCM required this visit?  No     Plan:     I have personally reviewed and noted the following in the patient's chart:   Medical and social history Use of alcohol, tobacco or illicit drugs  Current medications and supplements including opioid prescriptions. Patient is not currently taking opioid prescriptions. Functional ability and status Nutritional status Physical activity Advanced directives List of other physicians Hospitalizations, surgeries, and ER visits in previous 12 months Vitals Screenings to include cognitive, depression, and falls Referrals and appointments  In addition, I have reviewed and discussed with patient certain preventive protocols, quality metrics, and best practice recommendations. A written personalized care plan for preventive services as well as general preventive health recommendations were provided to patient.     Tillie Rung, LPN   9/60/4540   After Visit Summary: (MyChart) Due to this being a telephonic visit, the after visit summary with patients personalized plan was offered to patient via MyChart   Nurse Notes: None

## 2023-01-21 NOTE — Patient Instructions (Addendum)
Ms. Teta , Thank you for taking time to come for your Medicare Wellness Visit. I appreciate your ongoing commitment to your health goals. Please review the following plan we discussed and let me know if I can assist you in the future.   Referrals/Orders/Follow-Ups/Clinician Recommendations:   This is a list of the screening recommended for you and due dates:  Health Maintenance  Topic Date Due   Zoster (Shingles) Vaccine (1 of 2) 07/03/2001   Flu Shot  11/28/2022   COVID-19 Vaccine (3 - 2023-24 season) 12/29/2022   Medicare Annual Wellness Visit  01/21/2024   Mammogram  03/13/2024   Colon Cancer Screening  12/12/2024   DTaP/Tdap/Td vaccine (2 - Td or Tdap) 11/20/2028   Pneumonia Vaccine  Completed   DEXA scan (bone density measurement)  Completed   Hepatitis C Screening  Completed   HPV Vaccine  Aged Out    Advanced directives: (Declined) Advance directive discussed with you today. Even though you declined this today, please call our office should you change your mind, and we can give you the proper paperwork for you to fill out.  Next Medicare Annual Wellness Visit scheduled for next year: Yes

## 2023-01-28 DIAGNOSIS — Z85828 Personal history of other malignant neoplasm of skin: Secondary | ICD-10-CM | POA: Diagnosis not present

## 2023-01-28 DIAGNOSIS — L821 Other seborrheic keratosis: Secondary | ICD-10-CM | POA: Diagnosis not present

## 2023-01-28 DIAGNOSIS — L820 Inflamed seborrheic keratosis: Secondary | ICD-10-CM | POA: Diagnosis not present

## 2023-01-28 DIAGNOSIS — Z08 Encounter for follow-up examination after completed treatment for malignant neoplasm: Secondary | ICD-10-CM | POA: Diagnosis not present

## 2023-05-26 ENCOUNTER — Encounter: Payer: Self-pay | Admitting: Podiatry

## 2023-05-26 ENCOUNTER — Ambulatory Visit: Payer: Medicare PPO | Admitting: Podiatry

## 2023-05-26 DIAGNOSIS — L600 Ingrowing nail: Secondary | ICD-10-CM | POA: Diagnosis not present

## 2023-05-26 DIAGNOSIS — M79675 Pain in left toe(s): Secondary | ICD-10-CM | POA: Diagnosis not present

## 2023-05-26 DIAGNOSIS — B351 Tinea unguium: Secondary | ICD-10-CM

## 2023-05-26 NOTE — Progress Notes (Signed)
Subjective:   Patient ID: Misty Vega, female   DOB: 73 y.o.   MRN: [id number]   HPI Patient presents concerned about thickness of nailbeds 2 and 3 left and discoloration left fingernail and states that the 2 toes are getting painful.  Patient does not smoke likes to be active   Review of Systems  All other systems reviewed and are negative.       Objective:  Physical Exam Vitals and nursing note reviewed.  Constitutional:      Appearance: She is well-developed.  Pulmonary:     Effort: Pulmonary effort is normal.  Musculoskeletal:        General: Normal range of motion.  Skin:    General: Skin is warm.  Neurological:     Mental Status: She is alert.     Neurovascular status intact muscle strength adequate range of motion adequate with thick dystrophic nailbeds 2 and 3 on the left foot that do get painful and make it hard to wear shoe gear comfortably with discoloration of the left big toenail     Assessment:  Combination mycotic nail beds along with ingrown toenail component especially left second nail     Plan:  H&P reviewed and discussed.  At this point I did discuss possible removal of the nailbed educated her on the surgery what would be required.  Today I did debride the nailbed 1-5 left no intra genic bleeding we will see the results of this and then decide on a more aggressive approach

## 2023-07-07 ENCOUNTER — Ambulatory Visit: Admitting: Family

## 2023-07-07 ENCOUNTER — Encounter: Payer: Self-pay | Admitting: Family

## 2023-07-07 VITALS — BP 134/76 | HR 67 | Temp 97.7°F | Wt 159.6 lb

## 2023-07-07 DIAGNOSIS — Z20822 Contact with and (suspected) exposure to covid-19: Secondary | ICD-10-CM

## 2023-07-07 DIAGNOSIS — B9789 Other viral agents as the cause of diseases classified elsewhere: Secondary | ICD-10-CM | POA: Diagnosis not present

## 2023-07-07 DIAGNOSIS — J040 Acute laryngitis: Secondary | ICD-10-CM

## 2023-07-07 DIAGNOSIS — J029 Acute pharyngitis, unspecified: Secondary | ICD-10-CM | POA: Diagnosis not present

## 2023-07-07 LAB — POCT RAPID STREP A (OFFICE): Rapid Strep A Screen: NEGATIVE

## 2023-07-07 LAB — POC COVID19 BINAXNOW: SARS Coronavirus 2 Ag: NEGATIVE

## 2023-07-07 MED ORDER — PREDNISONE 10 MG (21) PO TBPK: ORAL_TABLET | ORAL | 0 refills | Status: DC

## 2023-07-07 NOTE — Progress Notes (Unsigned)
   Established Patient Office Visit  Subjective:   Patient ID: Misty Vega, female    DOB: [birth year]  Age: 73 y.o. MRN: [id number]  CC:  Chief Complaint  Patient presents with   Acute Visit    Reports lots of coughing and hoarseness x4 days.    HPI: Misty Vega is a 73 y.o. female presenting on 07/07/2023 for Acute Visit (Reports lots of coughing and hoarseness x4 days.)   About four days ago started with sore throat, coughing a bunch, and hoarseness. She also had a lot of diarrhea in the first few days but improving. No abdominal pain. No ear pain, some nasal congestion and a headache.   Over the meds she has taken are ibuprofen and airborne      ROS: Negative unless specifically indicated above in HPI.   Relevant past medical history reviewed and updated as indicated.   Allergies and medications reviewed and updated.   Current Outpatient Medications:    Calcium-Vitamin D-Vitamin K (VIACTIV PO), Take 1 capsule by mouth daily. , Disp: , Rfl:    Multiple Vitamin (MULTIVITAMIN WITH MINERALS) TABS tablet, Take 1 tablet by mouth daily., Disp: , Rfl:   No Known Allergies  Objective:   BP 134/76 (BP Location: Left Arm, Patient Position: Sitting, Cuff Size: Normal)   Pulse 67   Temp 97.7 F (36.5 C) (Temporal)   Wt 159 lb 9.6 oz (72.4 kg)   SpO2 96%   BMI 25.76 kg/m    Physical Exam Constitutional:      General: She is not in acute distress.    Appearance: Normal appearance. She is normal weight. She is not ill-appearing, toxic-appearing or diaphoretic.  HENT:     Head: Normocephalic.     Right Ear: Tympanic membrane normal.     Left Ear: Tympanic membrane normal.     Nose: Nose normal.     Mouth/Throat:     Mouth: Mucous membranes are dry.     Pharynx: No oropharyngeal exudate or posterior oropharyngeal erythema.  Eyes:     Extraocular Movements: Extraocular movements intact.     Pupils: Pupils are equal, round, and reactive to light.   Cardiovascular:     Rate and Rhythm: Normal rate and regular rhythm.     Pulses: Normal pulses.     Heart sounds: Normal heart sounds.  Pulmonary:     Effort: Pulmonary effort is normal.     Breath sounds: Normal breath sounds.  Musculoskeletal:     Cervical back: Normal range of motion.  Neurological:     General: No focal deficit present.     Mental Status: She is alert and oriented to person, place, and time. Mental status is at baseline.  Psychiatric:        Mood and Affect: Mood normal.        Behavior: Behavior normal.        Thought Content: Thought content normal.        Judgment: Judgment normal.     Assessment & Plan:  Suspected COVID-19 virus infection -     POC COVID-19 BinaxNow  Sore throat -     POCT rapid strep A     Follow up plan: No follow-ups on file.  Mort Sawyers, FNP

## 2023-07-08 DIAGNOSIS — B9789 Other viral agents as the cause of diseases classified elsewhere: Secondary | ICD-10-CM | POA: Insufficient documentation

## 2023-07-08 NOTE — Assessment & Plan Note (Signed)
 Covid strep negative Advised patient on supportive measures:  Be sure to rest, drink plenty of fluids, and use tylenol or ibuprofen as needed for pain. Follow up if fever >101, if symptoms worsen or if symptoms are not improved in 3 days. Patient verbalizes understanding.  Rx prednisone pack take as directed

## 2023-07-14 ENCOUNTER — Telehealth: Payer: Self-pay

## 2023-07-14 DIAGNOSIS — R051 Acute cough: Secondary | ICD-10-CM

## 2023-07-14 MED ORDER — GUAIFENESIN-CODEINE 100-10 MG/5ML PO SOLN: 5.0000 mL | Freq: Three times a day (TID) | ORAL | 0 refills | Status: AC | PRN

## 2023-07-14 MED ORDER — BENZONATATE 200 MG PO CAPS: 200.0000 mg | ORAL_CAPSULE | Freq: Two times a day (BID) | ORAL | 0 refills | Status: DC | PRN

## 2023-07-14 NOTE — Telephone Encounter (Signed)
 Spoke with pt and she states that she doesn't feel like she has had much improvement with the prednisone. She is aware of Tabitha's response. Pt will let us know if she does not continue to improve.

## 2023-07-14 NOTE — Telephone Encounter (Signed)
 No improvement with the prednisone?  I am sending in cough medication for night time it has codeine in it so only take it at night because it will make you drowsy and tired.   Sending in tessalon perrles for daytime. Recommend otc mucinex if not already taking.   However if sob and or doe, and or worsening symptoms overall have her get back in.

## 2023-07-14 NOTE — Addendum Note (Signed)
 Addended by: Mort Sawyers on: 07/14/2023 08:55 AM   Modules accepted: Orders

## 2023-07-14 NOTE — Telephone Encounter (Signed)
 Copied from CRM [id number]. Topic: General - Other >> Jul 14, 2023  8:26 AM [place] A wrote: Reason for CRM: Patient called to ask if Mort Sawyers can prescribe her medication for continuously coughing, in which she can not rest at night and her sides and stomach hurt from coughing-patient would like to be notified if medication is called in

## 2023-07-15 ENCOUNTER — Telehealth: Payer: Self-pay | Admitting: Family

## 2023-07-15 NOTE — Telephone Encounter (Signed)
 Spoke with pt. She just wanted to make sure that Misty Vega was aware that the medication she sent in for her is working well and she is feeling better. While speaking to her she needed a note for work stating that she saw [place] on the 07/07/23. This has been taken care of. Nothing further was needed.

## 2023-07-15 NOTE — Telephone Encounter (Signed)
 LM for pt to returncall

## 2023-07-15 NOTE — Telephone Encounter (Signed)
 Spoke with pt once again and she wants to let Tabitha know that the medicine she gave her works and pt is feeling a lot better pt apologize to me because pt thought she spoke directly to the office pt would like a like note saying she's been under Tabitha care pt would like a call back

## 2023-07-15 NOTE — Telephone Encounter (Signed)
 Pt called in very upset pt wants to talk to [place] I let pt know we don't have a [place] that works here PT  was hearing that its not a [place] that works here pt wanted a called in a   prescription.. pt  stated .I told pt  she  needs to make a f/u appt pt keep saying I want to speak to the person I spoke with yesterday  I informed pt that we do have a [place] pt then stated that  Suzette Battiest was going to tell the provider what she  need to be call in then pt got made and said I dont understand what she's saying and said bye and hung up

## 2023-07-15 NOTE — Telephone Encounter (Signed)
 Copied from CRM [id number]. Topic: General - Other >> Jul 15, 2023  2:12 PM Sim Boast F wrote: Reason for CRM: Patient returning Krislyn Donnan's phone call

## 2023-09-21 ENCOUNTER — Emergency Department (HOSPITAL_BASED_OUTPATIENT_CLINIC_OR_DEPARTMENT_OTHER)
Admission: EM | Admit: 2023-09-21 | Discharge: 2023-09-21 | Disposition: A | Discharge status: Home / Self Care | Payer: Worker's Compensation | Attending: Emergency Medicine | Admitting: Emergency Medicine

## 2023-09-21 ENCOUNTER — Other Ambulatory Visit: Payer: Self-pay

## 2023-09-21 ENCOUNTER — Encounter (HOSPITAL_BASED_OUTPATIENT_CLINIC_OR_DEPARTMENT_OTHER): Payer: Self-pay | Admitting: Emergency Medicine

## 2023-09-21 DIAGNOSIS — W010XXA Fall on same level from slipping, tripping and stumbling without subsequent striking against object, initial encounter: Secondary | ICD-10-CM | POA: Insufficient documentation

## 2023-09-21 DIAGNOSIS — S59912A Unspecified injury of left forearm, initial encounter: Secondary | ICD-10-CM | POA: Diagnosis present

## 2023-09-21 DIAGNOSIS — S42402A Unspecified fracture of lower end of left humerus, initial encounter for closed fracture: Secondary | ICD-10-CM | POA: Insufficient documentation

## 2023-09-21 MED ORDER — HYDROCODONE-ACETAMINOPHEN 5-325 MG PO TABS: 1.0000 | ORAL_TABLET | Freq: Four times a day (QID) | ORAL | 0 refills | Status: DC | PRN

## 2023-09-21 MED ORDER — HYDROCODONE-ACETAMINOPHEN 5-325 MG PO TABS
1.0000 | ORAL_TABLET | Freq: Once | ORAL | Status: AC
  Administered 2023-09-21: 1 via ORAL
  Filled 2023-09-21: qty 1

## 2023-09-21 NOTE — Discharge Instructions (Signed)
 Take medication only as prescribed.  Do not work or drive if taking the strong pain medication as it can make you drowsy, dizzy, and potentially put you at risk of falling.  Sometimes these medications can make you nauseous.  Take with food.  Please follow-up with your doctor and orthopedist this coming week as planned.

## 2023-09-21 NOTE — ED Notes (Signed)
 Dc instructions reviewed with patient. Patient voiced understanding. Dc with belongings.

## 2023-09-21 NOTE — ED Triage Notes (Signed)
 Trip fall on Wednesday Left arm injury.has been seen and treated for injury Comes for pain management. 8/10 rating for pain, after ibuprofen (200mg ) and tylenol 

## 2023-09-21 NOTE — ED Provider Notes (Signed)
  EMERGENCY DEPARTMENT AT [facility] Provider Note   CSN: [id number] Arrival date & time: 09/21/23  1606     History  Chief Complaint  Patient presents with   Misty Vega is a 73 y.o. female.  Patient presents to the emergency department for pain control after recent injury.  Patient works for [employer].  She had a fall onto concrete approximately 4 days ago.  She sustained a fracture in her left elbow and then a "minor fracture" of her right elbow.  Her left elbow was splinted and in a sling.  She has a compression sleeve on her right elbow.  She states that she was told not to straighten the arms.  She has follow-up with her orthopedist this coming week.  She has been taking Tylenol  and ibuprofen but the pain is still unbearable.  She denies numbness or tingling in the hands or the wrists.  Requesting medication for pain.  She has not taken stronger pain medications in the past.  She denies chronic medical problems or anticoagulation.       Home Medications Prior to Admission medications   Medication Sig Start Date End Date Taking? Authorizing Provider  HYDROcodone -acetaminophen  (NORCO/VICODIN) 5-325 MG tablet Take 1 tablet by mouth every 6 (six) hours as needed for severe pain (pain score 7-10) or moderate pain (pain score 4-6). 09/21/23  Yes Benisha Hadaway, PA-C  benzonatate  (TESSALON ) 200 MG capsule Take 1 capsule (200 mg total) by mouth 2 (two) times daily as needed for cough. 07/14/23   Dugal, Tabitha, FNP  Calcium-Vitamin D-Vitamin K (VIACTIV PO) Take 1 capsule by mouth daily.     [provider]  Multiple Vitamin (MULTIVITAMIN WITH MINERALS) TABS tablet Take 1 tablet by mouth daily.    [provider]  predniSONE  (STERAPRED UNI-PAK 21 TAB) 10 MG (21) TBPK tablet Take as directed 07/07/23   Dugal, Tabitha, FNP      Allergies    Patient has no known allergies.    Review of Systems   Review of Systems  Physical  Exam Updated Vital Signs BP (!) 176/77 (BP Location: Right Arm)   Pulse 61   Temp 98.1 F (36.7 C)   Resp 16   SpO2 95%   Physical Exam Vitals and nursing note reviewed.  Constitutional:      Appearance: She is well-developed.  HENT:     Head: Normocephalic and atraumatic.  Eyes:     Pupils: Pupils are equal, round, and reactive to light.  Cardiovascular:     Pulses: Normal pulses. No decreased pulses.  Musculoskeletal:        General: Tenderness present.     Cervical back: Normal range of motion and neck supple.     Comments: Cap refill less than 2 seconds in digits of both hands.  Patient is able to flex and extend at the wrists.  Normal distal sensation.  Normal color and temperature.  Left elbow immobilized in a splint and is in a sling.  Patient has a compression sleeve on the right elbow area.  2+ radial pulses bilaterally.  Skin:    General: Skin is warm and dry.  Neurological:     Mental Status: She is alert.     Sensory: No sensory deficit.     Comments: Motor, sensation, and vascular distal to the injury is fully intact.   Psychiatric:        Mood and Affect: Mood normal.     ED  Results / Procedures / Treatments   Labs (all labs ordered are listed, but only abnormal results are displayed) Labs Reviewed - No data to display  EKG None  Radiology No results found.  Procedures Procedures    Medications Ordered in ED Medications  HYDROcodone -acetaminophen  (NORCO/VICODIN) 5-325 MG per tablet 1 tablet (1 tablet Oral Given 09/21/23 1710)    ED Course/ Medical Decision Making/ A&P    Patient seen and examined. History obtained directly from patient.   Labs/EKG: None ordered  Imaging: None ordered  Medications/Fluids: Ordered: P.o. Vicodin  Most recent vital signs reviewed and are as follows: BP (!) 176/77 (BP Location: Right Arm)   Pulse 61   Temp 98.1 F (36.7 C)   Resp 16   SpO2 95%   Initial impression: Pain from recent  fractures  Patient was seen at a clinic through her job.  I am unable to see x-ray results.  She is here with her sister.  She is in appropriate immobilization.  I have no reason to doubt that she has the fractures she states.  She seems appropriate.  We discussed risks and benefits of stronger pain medication.  She has not taken oxycodone or hydrocodone  in the past.  No controlled substances on the database.  Home treatment plan: Continued routine care.  Pain medication as needed.  # 10 tablets Vicodin prescribed.  Patient counseled on use of narcotic pain medications. Counseled not to combine these medications with others containing tylenol . Urged not to drink alcohol, drive, or perform any other activities that requires focus while taking these medications. The patient verbalizes understanding and agrees with the plan.  Return instructions discussed with patient: New or worsening symptoms  Follow-up instructions discussed with patient: Orthopedist this week                                 Medical Decision Making Risk Prescription drug management.   Patient with recent workplace injury.  Clinic was unable to prescribe stronger pain medication.  She has tried Tylenol  and ibuprofen for 3 days now with poor control of pain.  I feel that given her reported injury, it was reasonable to give her a short course of pain medication.  No inconsistencies in database.  Discussed precautions.        Final Clinical Impression(s) / ED Diagnoses Final diagnoses:  Closed fracture of left elbow, initial encounter    Rx / DC Orders ED Discharge Orders          Ordered    HYDROcodone -acetaminophen  (NORCO/VICODIN) 5-325 MG tablet  Every 6 hours PRN        09/21/23 1708              Lyna Sandhoff, PA-C 09/21/23 1715    Russella Courts A, DO 09/30/23 1601

## 2024-01-01 ENCOUNTER — Other Ambulatory Visit: Payer: Self-pay | Admitting: Family

## 2024-01-01 DIAGNOSIS — Z1231 Encounter for screening mammogram for malignant neoplasm of breast: Secondary | ICD-10-CM

## 2024-01-13 ENCOUNTER — Encounter: Payer: Self-pay | Admitting: Family

## 2024-01-13 ENCOUNTER — Ambulatory Visit: Admitting: Family

## 2024-01-13 ENCOUNTER — Ambulatory Visit: Payer: Self-pay | Admitting: Family

## 2024-01-13 VITALS — BP 178/92 | HR 80 | Temp 98.0°F | Ht 66.0 in | Wt 166.0 lb

## 2024-01-13 DIAGNOSIS — R030 Elevated blood-pressure reading, without diagnosis of hypertension: Secondary | ICD-10-CM | POA: Diagnosis not present

## 2024-01-13 DIAGNOSIS — Z0000 Encounter for general adult medical examination without abnormal findings: Secondary | ICD-10-CM

## 2024-01-13 DIAGNOSIS — F4322 Adjustment disorder with anxiety: Secondary | ICD-10-CM | POA: Diagnosis not present

## 2024-01-13 DIAGNOSIS — Z780 Asymptomatic menopausal state: Secondary | ICD-10-CM

## 2024-01-13 DIAGNOSIS — Z136 Encounter for screening for cardiovascular disorders: Secondary | ICD-10-CM | POA: Diagnosis not present

## 2024-01-13 DIAGNOSIS — D649 Anemia, unspecified: Secondary | ICD-10-CM

## 2024-01-13 LAB — BASIC METABOLIC PANEL WITH GFR
BUN: 12 mg/dL (ref 6–23)
CO2: 27 meq/L (ref 19–32)
Calcium: 9 mg/dL (ref 8.4–10.5)
Chloride: 105 meq/L (ref 96–112)
Creatinine, Ser: 0.91 mg/dL (ref 0.40–1.20)
GFR: 63.02 mL/min (ref >60.00)
Glucose, Bld: 95 mg/dL (ref 70–99)
Potassium: 3.9 meq/L (ref 3.5–5.1)
Sodium: 141 meq/L (ref 135–145)

## 2024-01-13 LAB — CBC
HCT: 35.3 % — ABNORMAL LOW (ref 36.0–46.0)
Hemoglobin: 11.5 g/dL — ABNORMAL LOW (ref 12.0–15.0)
MCHC: 32.6 g/dL (ref 30.0–36.0)
MCV: 78.9 fl (ref 78.0–100.0)
Platelets: 276 K/uL (ref 150.0–400.0)
RBC: 4.47 Mil/uL (ref 3.87–5.11)
RDW: 14.9 % (ref 11.5–15.5)
WBC: 4.7 K/uL (ref 4.0–10.5)

## 2024-01-13 LAB — LIPID PANEL
Cholesterol: 169 mg/dL (ref 0–200)
HDL: 67.5 mg/dL (ref >39.00)
LDL Cholesterol: 82 mg/dL (ref 0–99)
NonHDL: 101.5
Total CHOL/HDL Ratio: 3
Triglycerides: 96 mg/dL (ref 0.0–149.0)
VLDL: 19.2 mg/dL (ref 0.0–40.0)

## 2024-01-13 LAB — TSH: TSH: 1.31 u[IU]/mL (ref 0.35–5.50)

## 2024-01-13 MED ORDER — SERTRALINE HCL 50 MG PO TABS: 50.0000 mg | ORAL_TABLET | Freq: Every day | ORAL | 1 refills | Status: AC

## 2024-01-13 NOTE — Patient Instructions (Addendum)
   Start sertraline  50 mg for anxiety. Take 1/2 tablet by mouth once daily for about one week, then increase to 1 full tablet thereafter.   Taking the medicine as directed and not missing any doses is one of the best things you can do to treat your anxiety Here are some things to keep in mind:  Side effects (stomach upset, some increased anxiety) may happen before you notice a benefit.  These side effects typically go away over time. Changes to your dose of medicine or a change in medication all together is sometimes necessary Many people will notice an improvement within two weeks but the full effect of the medication can take up to 4-6 weeks Stopping the medication when you start feeling better often results in a return of symptoms. Most people need to be on medication at least 6-12 months If you start having thoughts of hurting yourself or others after starting this medicine, please call me immediately.    ------------------------------------  Shingles vaccination recommended   ------------------------------------ I have sent an electronic order over to your preferred location for the following:   []   2D Mammogram  [x]   3D Mammogram  []   Bone Density   Please give this center a call to get scheduled at your convenience.   [x]   [facility]      [address] [place], [place]        [phone]         Make sure to wear two piece  clothing  No lotions powders or deodorants the day of the appointment Make sure to bring picture ID and insurance card.  Bring list of medications you are currently taking including any supplements.

## 2024-01-13 NOTE — Progress Notes (Signed)
 Subjective:  Patient ID: Misty Vega, female    DOB: 1950-07-29  Age: 73 y.o. MRN: [id number]  Patient Care Team: Corwin Antu, FNP as PCP - General (Family Medicine)   CC:  Chief Complaint  Patient presents with   Medical Management of Chronic Issues    HPI Misty Vega is a 73 y.o. female who presents today for an annual physical exam. She reports consuming a general diet. Walking a few times a week She generally feels well. She reports sleeping well. She does have additional problems to discuss today.   Vision:Within last year Dental:Receives regular dental care  Mammogram: scheduled for 01/15/24 Last pap: > 65  Colonoscopy: 12/12/24 due every three years Bone density scan: 03/13/22  Pt is with acute concerns.  Discussed the use of AI scribe software for clinical note transcription with the patient, who gave verbal consent to proceed.  History of Present Illness Misty Vega is a 73 year old female who presents with anxiety and stress related to caregiving responsibilities and recent personal injuries.  She experiences anxiety and stress primarily due to her caregiving responsibilities and recent personal injuries. She feels on edge and jittery and is seeking medication to help manage these symptoms. She has a history of taking Paxil for four to five years after the loss of her son but discontinued it after some time.  In May, she fell at work and sustained bilateral elbow fractures, requiring her to be in a cast and sling. She is currently undergoing physical therapy and dealing with workers' compensation, which she finds stressful. She has been out of work since the incident.  This situation has been a significant source of stress for her, as she is now a primary caregiver. She notes that her husband has become irritable, and she finds it challenging to manage his repeated questions and stubbornness.  She mentions having a supportive  network, including a friend from church who is in a similar situation, and her siblings, although she feels they do not fully understand her situation. She wants personal time and acknowledges the difficulty in achieving this due to her caregiving responsibilities.  No depression, but she feels irritated and agitated. She has been managing her own health appointments, including a scheduled mammogram and recent dental and eye exams. She has a history of shingles.   Advanced Directives Patient does not have advanced directives   DEPRESSION SCREENING    01/21/2023    8:57 AM 08/16/2022    8:12 AM 08/16/2022    8:05 AM 08/08/2022    9:12 AM 02/12/2022    9:39 AM 01/22/2018    9:57 AM 05/26/2017   10:50 AM  PHQ 2/9 Scores  PHQ - 2 Score 0 0 0 0 0 0 0  PHQ- 9 Score     0       ROS: Negative unless specifically indicated above in HPI.    Current Outpatient Medications:    Calcium-Vitamin D-Vitamin K (VIACTIV PO), Take 1 capsule by mouth daily. , Disp: , Rfl:    HYDROcodone -acetaminophen  (NORCO/VICODIN) 5-325 MG tablet, Take 1 tablet by mouth every 6 (six) hours as needed for severe pain (pain score 7-10) or moderate pain (pain score 4-6)., Disp: 10 tablet, Rfl: 0   Multiple Vitamin (MULTIVITAMIN WITH MINERALS) TABS tablet, Take 1 tablet by mouth daily., Disp: , Rfl:    sertraline  (ZOLOFT ) 50 MG tablet, Take 1 tablet (50 mg total) by mouth daily., Disp: 30 tablet, Rfl: 1  Objective:    BP (!) 178/92 (BP Location: Left Arm, Patient Position: Sitting, Cuff Size: Normal)   Pulse 80   Temp 98 F (36.7 C) (Temporal)   Ht 5' 6 (1.676 m)   Wt 166 lb (75.3 kg)   SpO2 98%   BMI 26.79 kg/m   BP Readings from Last 3 Encounters:  01/13/24 (!) 178/92  09/21/23 (!) 176/77  07/07/23 134/76      Physical Exam Vitals reviewed.  Constitutional:      General: She is not in acute distress.    Appearance: Normal appearance. She is normal weight. She is not ill-appearing.  HENT:     Head:  Normocephalic.     Right Ear: Tympanic membrane normal.     Left Ear: Tympanic membrane normal.     Nose: Nose normal.     Mouth/Throat:     Mouth: Mucous membranes are moist.  Eyes:     Extraocular Movements: Extraocular movements intact.     Pupils: Pupils are equal, round, and reactive to light.  Cardiovascular:     Rate and Rhythm: Normal rate and regular rhythm.  Pulmonary:     Effort: Pulmonary effort is normal.     Breath sounds: Normal breath sounds.  Abdominal:     General: Abdomen is flat. Bowel sounds are normal.     Palpations: Abdomen is soft.     Tenderness: There is no guarding or rebound.  Musculoskeletal:        General: Normal range of motion.     Cervical back: Normal range of motion.  Skin:    General: Skin is warm.     Capillary Refill: Capillary refill takes less than 2 seconds.  Neurological:     General: No focal deficit present.     Mental Status: She is alert.  Psychiatric:        Mood and Affect: Mood normal.        Behavior: Behavior normal.        Thought Content: Thought content normal.        Judgment: Judgment normal.       Results       Assessment & Plan:   Assessment and Plan Assessment & Plan  Assessment and Plan Assessment & Plan    Acute wellness visit  Patient Counseling(The following topics were reviewed):  Preventative care handout given to pt  Health maintenance and immunizations reviewed. Please refer to Health maintenance section. Pt advised on safe sex, wearing seatbelts in car, and proper nutrition labwork ordered today for annual Dental health: Discussed importance of regular tooth brushing, flossing, and dental visits.   Generalized anxiety disorder Generalized anxiety disorder with symptoms of feeling on edge, jittery, and agitated. No current depression, but significant stress due to personal circumstances, including husband's early-stage dementia and recent loss of eyesight. Previous use of Paxil was  effective, but weight gain is a concern. Zoloft  (sertraline ) is considered a suitable alternative due to its efficacy in treating anxiety and agitation. - Prescribe Zoloft  (sertraline ) starting at half a tablet for the first week, then increase to one tablet daily if tolerated. - Consider therapy or support groups for additional support. - Encourage utilization of available help to ensure personal time and reduce caregiver burden.  Elevated blood pressure Blood pressure is elevated at 182/100 mmHg, significantly above the target of less than 130/90 mmHg. This elevation poses a risk for stroke and heart disease. Further evaluation is necessary. - Monitor blood pressure at home  twice daily, ensuring proper technique with feet flat on the floor and resting for 5-10 minutes before measurement. - Recheck blood pressure in three weeks during follow-up appointment. - Order basic labs including thyroid function tests to evaluate potential contributing factors.  General Health Maintenance Routine health maintenance is being addressed. Mammogram is scheduled, and colonoscopy is up to date. Bone density scan is due next year. Shingles vaccine is recommended due to previous shingles episode. - Order bone density scan and coordinate with mammogram appointment if possible. - Recommend shingles vaccine, to be administered at a pharmacy. - Ensure routine eye and dental exams are up to date.  Recording duration: 23 minutes        Follow-up: Return in about 3 weeks (around 02/03/2024) for f/u blood pressure.   Ginger Patrick, FNP

## 2024-01-14 ENCOUNTER — Ambulatory Visit (INDEPENDENT_AMBULATORY_CARE_PROVIDER_SITE_OTHER)

## 2024-01-14 DIAGNOSIS — D649 Anemia, unspecified: Secondary | ICD-10-CM

## 2024-01-14 LAB — IBC + FERRITIN
Ferritin: 7.7 ng/mL — ABNORMAL LOW (ref 10.0–291.0)
Iron: 38 ug/dL — ABNORMAL LOW (ref 42–145)
Saturation Ratios: 7.8 % — ABNORMAL LOW (ref 20.0–50.0)
TIBC: 487.2 ug/dL — ABNORMAL HIGH (ref 250.0–450.0)
Transferrin: 348 mg/dL (ref 212.0–360.0)

## 2024-01-15 ENCOUNTER — Ambulatory Visit: Payer: Self-pay | Admitting: Family

## 2024-01-15 ENCOUNTER — Ambulatory Visit

## 2024-01-15 ENCOUNTER — Other Ambulatory Visit: Payer: Self-pay | Admitting: Family

## 2024-01-15 DIAGNOSIS — D508 Other iron deficiency anemias: Secondary | ICD-10-CM

## 2024-01-15 MED ORDER — IRON (FERROUS SULFATE) 325 (65 FE) MG PO TABS: 325.0000 mg | ORAL_TABLET | ORAL | 1 refills | Status: AC

## 2024-01-15 NOTE — Progress Notes (Signed)
[place]  

## 2024-01-21 ENCOUNTER — Ambulatory Visit
Admission: RE | Admit: 2024-01-21 | Discharge: 2024-01-21 | Disposition: A | Discharge status: Home / Self Care | Source: Ambulatory Visit | Attending: Family

## 2024-01-21 DIAGNOSIS — Z1231 Encounter for screening mammogram for malignant neoplasm of breast: Secondary | ICD-10-CM

## 2024-01-25 ENCOUNTER — Ambulatory Visit: Payer: Self-pay | Admitting: Family

## 2024-01-26 ENCOUNTER — Other Ambulatory Visit: Payer: Self-pay | Admitting: Family

## 2024-01-26 DIAGNOSIS — R928 Other abnormal and inconclusive findings on diagnostic imaging of breast: Secondary | ICD-10-CM

## 2024-01-27 ENCOUNTER — Ambulatory Visit

## 2024-01-27 VITALS — Ht 66.0 in | Wt 166.0 lb

## 2024-01-27 DIAGNOSIS — Z0000 Encounter for general adult medical examination without abnormal findings: Secondary | ICD-10-CM | POA: Diagnosis not present

## 2024-01-27 NOTE — Patient Instructions (Signed)
 Ms. Misty Vega,  Thank you for taking the time for your Medicare Wellness Visit. I appreciate your continued commitment to your health goals. Please review the care plan we discussed, and feel free to reach out if I can assist you further.  Medicare recommends these wellness visits once per year to help you and your care team stay ahead of potential health issues. These visits are designed to focus on prevention, allowing your provider to concentrate on managing your acute and chronic conditions during your regular appointments.  Please note that Annual Wellness Visits do not include a physical exam. Some assessments may be limited, especially if the visit was conducted virtually. If needed, we may recommend a separate in-person follow-up with your provider.  Ongoing Care Seeing your primary care provider every 3 to 6 months helps us  monitor your health and provide consistent, personalized care.   Referrals If a referral was made during today's visit and you haven't received any updates within two weeks, please contact the referred provider directly to check on the status.  Recommended Screenings:  Health Maintenance  Topic Date Due   Zoster (Shingles) Vaccine (1 of 2) 07/03/2001   COVID-19 Vaccine (3 - 2025-26 season) 12/29/2023   Flu Shot  07/27/2024*   Colon Cancer Screening  12/12/2024   Medicare Annual Wellness Visit  01/26/2025   DEXA scan (bone density measurement)  03/13/2025   Breast Cancer Screening  01/20/2026   DTaP/Tdap/Td vaccine (2 - Td or Tdap) 11/20/2028   Pneumococcal Vaccine for age over 7  Completed   Hepatitis C Screening  Completed   HPV Vaccine  Aged Out   Meningitis B Vaccine  Aged Out  *Topic was postponed. The date shown is not the original due date.       09/21/2023    4:17 PM  Advanced Directives  Does Patient Have a Medical Advance Directive? No  Would patient like information on creating a medical advance directive? No - Patient declined   Advance  Care Planning is important because it: Ensures you receive medical care that aligns with your values, goals, and preferences. Provides guidance to your family and loved ones, reducing the emotional burden of decision-making during critical moments.  Vision: Annual vision screenings are recommended for early detection of glaucoma, cataracts, and diabetic retinopathy. These exams can also reveal signs of chronic conditions such as diabetes and high blood pressure.  Dental: Annual dental screenings help detect early signs of oral cancer, gum disease, and other conditions linked to overall health, including heart disease and diabetes.  Please see the attached documents for additional preventive care recommendations.

## 2024-01-27 NOTE — Progress Notes (Signed)
 Please attest and cosign this visit due to patients primary care provider not being in the office at the time the visit was completed.    Subjective:   Misty Vega is a 73 y.o. who presents for a Medicare Wellness preventive visit.  As a reminder, Annual Wellness Visits don't include a physical exam, and some assessments may be limited, especially if this visit is performed virtually. We may recommend an in-person follow-up visit with your provider if needed.  Visit Complete: Virtual I connected with  Adrien Vicci Schlossman on 01/27/24 by a audio enabled telemedicine application and verified that I am speaking with the correct person using two identifiers.  Patient Location: Home  Provider Location: Office/Clinic  I discussed the limitations of evaluation and management by telemedicine. The patient expressed understanding and agreed to proceed.  Vital Signs: Because this visit was a virtual/telehealth visit, some criteria may be missing or patient reported. Any vitals not documented were not able to be obtained and vitals that have been documented are patient reported.  VideoDeclined- This patient declined Librarian, academic. Therefore the visit was completed with audio only.  Persons Participating in Visit: Patient.  AWV Questionnaire: No: Patient Medicare AWV questionnaire was not completed prior to this visit.  Cardiac Risk Factors include: advanced age (>45men, >33 women)     Objective:    Today's Vitals   01/27/24 1548  Weight: 166 lb (75.3 kg)  Height: 5' 6 (1.676 m)   Body mass index is 26.79 kg/m.     01/27/2024    3:57 PM 09/21/2023    4:17 PM 01/21/2023    8:59 AM 02/12/2022    9:35 AM  Advanced Directives  Does Patient Have a Medical Advance Directive? No No No No  Would patient like information on creating a medical advance directive?  No - Patient declined No - Patient declined No - Patient declined    Current  Medications (verified) Outpatient Encounter Medications as of 01/27/2024  Medication Sig   Calcium-Vitamin D-Vitamin K (VIACTIV PO) Take 1 capsule by mouth daily.    Iron , Ferrous Sulfate , 325 (65 Fe) MG TABS Take 325 mg by mouth every other day.   Multiple Vitamin (MULTIVITAMIN WITH MINERALS) TABS tablet Take 1 tablet by mouth daily.   sertraline  (ZOLOFT ) 50 MG tablet Take 1 tablet (50 mg total) by mouth daily.   HYDROcodone -acetaminophen  (NORCO/VICODIN) 5-325 MG tablet Take 1 tablet by mouth every 6 (six) hours as needed for severe pain (pain score 7-10) or moderate pain (pain score 4-6). (Patient not taking: Reported on 01/27/2024)   No facility-administered encounter medications on file as of 01/27/2024.    Allergies (verified) Patient has no known allergies.   History: No past medical history on file. Past Surgical History:  Procedure Laterality Date   APPENDECTOMY     CATARACT EXTRACTION     COLONOSCOPY     POLYPECTOMY     Family History  Problem Relation Age of Onset   Liver cancer Mother        non drinker   Hypertension Father    Colon polyps Sister    Diabetes Neg Hx    Heart disease Neg Hx    Stroke Neg Hx    Colon cancer Neg Hx    Esophageal cancer Neg Hx    Stomach cancer Neg Hx    Rectal cancer Neg Hx    Breast cancer Neg Hx    Social History   Socioeconomic History  Marital status: Married    Spouse name: Not on file   Number of children: 1   Years of education: Not on file   Highest education level: Not on file  Occupational History   Not on file  Tobacco Use   Smoking status: Never   Smokeless tobacco: Never  Vaping Use   Vaping status: Never Used  Substance and Sexual Activity   Alcohol use: No    Alcohol/week: 0.0 standard drinks of alcohol   Drug use: No   Sexual activity: Yes    Partners: Male    Birth control/protection: Post-menopausal  Other Topics Concern   Not on file  Social History Narrative   One son, deceased   One step  daugther    Social Drivers of Corporate investment banker Strain: Low Risk  (01/27/2024)   Overall Financial Resource Strain (CARDIA)    Difficulty of Paying Living Expenses: Not hard at all  Food Insecurity: No Food Insecurity (01/27/2024)   Hunger Vital Sign    Worried About Running Out of Food in the Last Year: Never true    Ran Out of Food in the Last Year: Never true  Transportation Needs: No Transportation Needs (01/27/2024)   PRAPARE - Administrator, Civil Service (Medical): No    Lack of Transportation (Non-Medical): No  Physical Activity: Sufficiently Active (01/27/2024)   Exercise Vital Sign    Days of Exercise per Week: 5 days    Minutes of Exercise per Session: 30 min  Stress: No Stress Concern Present (01/27/2024)   [employer] of Occupational Health - Occupational Stress Questionnaire    Feeling of Stress: Not at all  Social Connections: Socially Integrated (01/27/2024)   Social Connection and Isolation Panel    Frequency of Communication with Friends and Family: More than three times a week    Frequency of Social Gatherings with Friends and Family: More than three times a week    Attends Religious Services: More than 4 times per year    Active Member of [employer] or Organizations: Yes    Attends Engineer, structural: More than 4 times per year    Marital Status: Married    Tobacco Counseling Counseling given: Not Answered    Clinical Intake:  Pre-visit preparation completed: Yes  Pain : No/denies pain     BMI - recorded: 26.79 Nutritional Status: BMI 25 -29 Overweight Nutritional Risks: None Diabetes: No  No results found for: HGBA1C   How often do you need to have someone help you when you read instructions, pamphlets, or other written materials from your doctor or pharmacy?: 1 - Never  Interpreter Needed?: No  Comments: lives with husband Information entered by :: B.Ezekiah Massie,LPN   Activities of Daily Living      01/27/2024    3:57 PM  In your present state of health, do you have any difficulty performing the following activities:  Hearing? 0  Vision? 0  Difficulty concentrating or making decisions? 0  Walking or climbing stairs? 0  Dressing or bathing? 0  Doing errands, shopping? 0  Preparing Food and eating ? N  Using the Toilet? N  In the past six months, have you accidently leaked urine? N  Do you have problems with loss of bowel control? N  Managing your Medications? N  Managing your Finances? N  Housekeeping or managing your Housekeeping? N    Patient Care Team: Corwin Antu, FNP as PCP - General (Family Medicine) [facility], [facility] (Optometry)  I have updated your Care Teams any recent Medical Services you may have received from other providers in the past year.     Assessment:   This is a routine wellness examination for Yukie.  Hearing/Vision screen Hearing Screening - Comments:: Patient denies any hearing difficulties.   Vision Screening - Comments:: Pt says their vision is good with contact Dr  Fiaoco next week   Goals Addressed               This Visit's Progress     Live a healthy life (pt-stated)   On track     01/27/24      Patient Stated        Stay healthy        Depression Screen     01/27/2024    3:55 PM 01/13/2024    9:31 AM 01/21/2023    8:57 AM 08/16/2022    8:12 AM 08/16/2022    8:05 AM 08/08/2022    9:12 AM 02/12/2022    9:39 AM  PHQ 2/9 Scores  PHQ - 2 Score 0 0 0 0 0 0 0  PHQ- 9 Score  0     0    Fall Risk     01/27/2024    3:51 PM 01/13/2024    9:31 AM 01/21/2023    8:58 AM 08/16/2022    8:05 AM 08/08/2022    9:12 AM  Fall Risk   Falls in the past year? 1 0 0 0 0  Number falls in past yr: 0 0 0 0 0  Injury with Fall? 1 0 0 0 0  Comment broke both elbows      Risk for fall due to : No Fall Risks No Fall Risks No Fall Risks    Follow up Education provided;Falls prevention discussed Falls evaluation completed Falls prevention  discussed Falls evaluation completed;Education provided;Falls prevention discussed Falls evaluation completed;Education provided;Falls prevention discussed    MEDICARE RISK AT HOME:  Medicare Risk at Home Any stairs in or around the home?: Yes If so, are there any without handrails?: Yes Home free of loose throw rugs in walkways, pet beds, electrical cords, etc?: Yes Adequate lighting in your home to reduce risk of falls?: Yes Life alert?: No Use of a cane, walker or w/c?: No Grab bars in the bathroom?: Yes Shower chair or bench in shower?: Yes Elevated toilet seat or a handicapped toilet?: No  TIMED UP AND GO:  Was the test performed?  No  Cognitive Function: 6CIT completed        01/27/2024    3:59 PM 01/21/2023    8:59 AM 02/12/2022    9:36 AM  6CIT Screen  What Year? 0 points 0 points 0 points  What month? 0 points 0 points 0 points  What time? 0 points 0 points 0 points  Count back from 20 0 points 0 points 0 points  Months in reverse 0 points 0 points 0 points  Repeat phrase 0 points 0 points 0 points  Total Score 0 points 0 points 0 points    Immunizations Immunization History  Administered Date(s) Administered   Influenza,inj,Quad PF,6+ Mos 03/05/2018   Janssen (J&J) SARS-COV-2 Vaccination 07/31/2019, 03/06/2020   Pneumococcal Conjugate-13 05/26/2017   Pneumococcal Polysaccharide-23 09/17/2021   Tdap 11/21/2018   Zoster, Live 03/13/2015    Screening Tests Health Maintenance  Topic Date Due   Zoster Vaccines- Shingrix (1 of 2) 07/03/2001   COVID-19 Vaccine (3 - 2025-26  season) 12/29/2023   Influenza Vaccine  07/27/2024 (Originally 11/28/2023)   Colonoscopy  12/12/2024   Medicare Annual Wellness (AWV)  01/26/2025   DEXA SCAN  03/13/2025   Mammogram  01/20/2026   DTaP/Tdap/Td (2 - Td or Tdap) 11/20/2028   Pneumococcal Vaccine: 50+ Years  Completed   Hepatitis C Screening  Completed   HPV VACCINES  Aged Out   Meningococcal B Vaccine  Aged Out    Health  Maintenance Items Addressed: None due at this time. Pt will receive vaccines at their pharmcy when decided to obtain   Additional Screening:  Vision Screening: Recommended annual ophthalmology exams for early detection of glaucoma and other disorders of the eye. Is the patient up to date with their annual eye exam?  Yes  Who is the provider or what is the name of the office in which the patient attends annual eye exams? Dr Georgia  Dental Screening: Recommended annual dental exams for proper oral hygiene  Community Resource Referral / Chronic Care Management: CRR required this visit?  No   CCM required this visit?  Appt scheduled with PCP   Plan:    I have personally reviewed and noted the following in the patient's chart:   Medical and social history Use of alcohol, tobacco or illicit drugs  Current medications and supplements including opioid prescriptions. Patient is not currently taking opioid prescriptions. Functional ability and status Nutritional status Physical activity Advanced directives List of other physicians Hospitalizations, surgeries, and ER visits in previous 12 months Vitals Screenings to include cognitive, depression, and falls Referrals and appointments  In addition, I have reviewed and discussed with patient certain preventive protocols, quality metrics, and best practice recommendations. A written personalized care plan for preventive services as well as general preventive health recommendations were provided to patient.   Erminio LITTIE Saris, LPN   [id number]   After Visit Summary: (MyChart) Due to this being a telephonic visit, the after visit summary with patients personalized plan was offered to patient via MyChart   Notes: Nothing significant to report at this time.

## 2024-01-29 DIAGNOSIS — H2512 Age-related nuclear cataract, left eye: Secondary | ICD-10-CM | POA: Diagnosis not present

## 2024-01-29 DIAGNOSIS — D3131 Benign neoplasm of right choroid: Secondary | ICD-10-CM | POA: Diagnosis not present

## 2024-01-29 DIAGNOSIS — H5202 Hypermetropia, left eye: Secondary | ICD-10-CM | POA: Diagnosis not present

## 2024-01-29 DIAGNOSIS — Z961 Presence of intraocular lens: Secondary | ICD-10-CM | POA: Diagnosis not present

## 2024-02-02 ENCOUNTER — Ambulatory Visit: Payer: Self-pay | Admitting: Family

## 2024-02-02 ENCOUNTER — Ambulatory Visit

## 2024-02-02 ENCOUNTER — Ambulatory Visit
Admission: RE | Admit: 2024-02-02 | Discharge: 2024-02-02 | Disposition: A | Discharge status: Home / Self Care | Source: Ambulatory Visit | Attending: Family | Admitting: Family

## 2024-02-02 DIAGNOSIS — R928 Other abnormal and inconclusive findings on diagnostic imaging of breast: Secondary | ICD-10-CM | POA: Diagnosis not present

## 2024-02-03 ENCOUNTER — Encounter: Payer: Self-pay | Admitting: Family

## 2024-02-03 ENCOUNTER — Ambulatory Visit: Admitting: Family

## 2024-02-03 VITALS — BP 150/92 | HR 78 | Temp 98.5°F | Ht 66.0 in | Wt 164.6 lb

## 2024-02-03 DIAGNOSIS — D508 Other iron deficiency anemias: Secondary | ICD-10-CM | POA: Diagnosis not present

## 2024-02-03 DIAGNOSIS — F4322 Adjustment disorder with anxiety: Secondary | ICD-10-CM

## 2024-02-03 DIAGNOSIS — I10 Essential (primary) hypertension: Secondary | ICD-10-CM

## 2024-02-03 MED ORDER — LOSARTAN POTASSIUM 50 MG PO TABS: 50.0000 mg | ORAL_TABLET | Freq: Every day | ORAL | 0 refills | Status: AC

## 2024-02-03 NOTE — Progress Notes (Signed)
 Established Patient Office Visit  Subjective:      CC:  Chief Complaint  Patient presents with   Follow-up    HPI: Misty Vega is a 73 y.o. female presenting on 02/03/2024 for Follow-up .  Discussed the use of AI scribe software for clinical note transcription with the patient, who gave verbal consent to proceed.  History of Present Illness Misty Vega is a 73 year old female with hypertension and anemia who presents for blood pressure management and follow-up on anemia.  Her blood pressure has been elevated, with home readings in the 150s/90s range and a previous visit recording of 182/100. She experiences headaches and nausea when her blood pressure is high. No chest pain or palpitations. She reports that her blood pressure improves as she gets moving in the morning.  She has a history of anemia with low iron  levels, recently measured at 38 (normal range 42-145) and ferritin at 7.7 (normal up to 291). She has been taking prescribed iron  supplements but finds them unpleasant due to their effect on her bowel movements. She follows a pescatarian diet and avoids gluten due to rashes.  She started taking sertraline  (Zoloft ) a few weeks ago and reports it is working well without noticeable side effects.         Social history:  Relevant past medical, surgical, family and social history reviewed and updated as indicated. Interim medical history since our last visit reviewed.  Allergies and medications reviewed and updated.  DATA REVIEWED: CHART IN EPIC     ROS: Negative unless specifically indicated above in HPI.    Current Outpatient Medications:    Calcium-Vitamin D-Vitamin K (VIACTIV PO), Take 1 capsule by mouth daily. , Disp: , Rfl:    Iron , Ferrous Sulfate , 325 (65 Fe) MG TABS, Take 325 mg by mouth every other day., Disp: 30 tablet, Rfl: 1   losartan (COZAAR) 50 MG tablet, Take 1 tablet (50 mg total) by mouth daily., Disp: 90  tablet, Rfl: 0   Multiple Vitamin (MULTIVITAMIN WITH MINERALS) TABS tablet, Take 1 tablet by mouth daily., Disp: , Rfl:    sertraline  (ZOLOFT ) 50 MG tablet, Take 1 tablet (50 mg total) by mouth daily., Disp: 30 tablet, Rfl: 1        Objective:        BP (!) 150/92 (BP Location: Left Arm, Patient Position: Sitting, Cuff Size: Large)   Pulse 78   Temp 98.5 F (36.9 C) (Temporal)   Ht 5' 6 (1.676 m)   Wt 164 lb 9.6 oz (74.7 kg)   SpO2 98%   BMI 26.57 kg/m   Physical Exam VITALS: BP- 172/82  Wt Readings from Last 3 Encounters:  02/03/24 164 lb 9.6 oz (74.7 kg)  01/27/24 166 lb (75.3 kg)  01/13/24 166 lb (75.3 kg)    Physical Exam Constitutional:      General: She is not in acute distress.    Appearance: Normal appearance. She is normal weight. She is not ill-appearing, toxic-appearing or diaphoretic.  HENT:     Head: Normocephalic.  Cardiovascular:     Rate and Rhythm: Normal rate and regular rhythm.  Pulmonary:     Effort: Pulmonary effort is normal.  Musculoskeletal:        General: Normal range of motion.  Neurological:     General: No focal deficit present.     Mental Status: She is alert and oriented to person, place, and time. Mental status is at baseline.  Psychiatric:  Mood and Affect: Mood normal.        Behavior: Behavior normal.        Thought Content: Thought content normal.        Judgment: Judgment normal.          Results LABS GFR: 63 mL/min/1.73 m Iron : 38 g/dL Ferritin: 7.7 ng/mL  Assessment & Plan:   Assessment and Plan Assessment & Plan Hypertension Blood pressure remains elevated, with readings often in the 150s/90s range. No chest pain or palpitations reported. Headache and nausea occur with high readings. Kidney function is good with a GFR of 63. - Start losartan for blood pressure management - Re-evaluate blood pressure in one month  Iron  deficiency anemia Mild anemia with low iron  levels (38) and very low ferritin  (7.7). Symptoms include fatigue. Current iron  supplementation is not well tolerated due to gastrointestinal side effects. - Consider switching to slow FE over-the-counter iron  supplement - Take iron  with orange juice to enhance absorption - Take iron  every other day to minimize constipation - Repeat iron  studies in one month  Major depressive disorder Started on sertraline  (Zoloft ) a few weeks ago. She reports improvement with no side effects. - Continue sertraline  - Reassess mood and medication efficacy in one month        Return in about 1 year (around 02/02/2025) for f/u blood pressure and IDA .     Ginger Patrick, MSN, APRN, FNP-C [facility]

## 2024-06-04 ENCOUNTER — Telehealth: Payer: Self-pay | Admitting: *Deleted

## 2024-06-04 NOTE — Telephone Encounter (Signed)
 Copied from CRM #[id number]. Topic: Clinical - Request for Lab/Test Order >> Jun 04, 2024  3:05 PM Ahlexyia S wrote: Reason for CRM: Pt called in stating that she has an upcoming bone density test at [facility] Pt stated she is currently scheduled on 04/16 but pt would like to go to [facility] in [place] instead due to [facility] being too far away from her. Pt stated she would like to have this done sooner than what she is scheduled. Pt is requesting a callback regarding this.

## 2024-06-15 ENCOUNTER — Encounter: Admitting: Family

## 2024-08-12 ENCOUNTER — Ambulatory Visit (HOSPITAL_BASED_OUTPATIENT_CLINIC_OR_DEPARTMENT_OTHER)

## 2025-01-27 ENCOUNTER — Ambulatory Visit

## 2025-01-28 ENCOUNTER — Ambulatory Visit
